# Patient Record
Sex: Female | Born: 1980 | Race: Black or African American | Hispanic: No | Marital: Single | Smoking: Former smoker
Health system: Southern US, Community
[De-identification: ages and names within clinical notes are randomized; demographics above are authoritative.]

## PROBLEM LIST (undated history)

## (undated) DIAGNOSIS — K219 Gastro-esophageal reflux disease without esophagitis: Secondary | ICD-10-CM

## (undated) DIAGNOSIS — M549 Dorsalgia, unspecified: Secondary | ICD-10-CM

---

## 2007-12-29 ENCOUNTER — Emergency Department (HOSPITAL_COMMUNITY): Admission: EM | Admit: 2007-12-29 | Discharge: 2007-12-29 | Payer: Self-pay | Admitting: Family Medicine

## 2008-01-02 ENCOUNTER — Emergency Department (HOSPITAL_COMMUNITY): Admission: EM | Admit: 2008-01-02 | Discharge: 2008-01-02 | Payer: Self-pay | Admitting: Family Medicine

## 2008-04-23 ENCOUNTER — Emergency Department (HOSPITAL_COMMUNITY): Admission: EM | Admit: 2008-04-23 | Discharge: 2008-04-24 | Payer: Self-pay | Admitting: Emergency Medicine

## 2008-04-29 ENCOUNTER — Encounter: Admission: RE | Admit: 2008-04-29 | Discharge: 2008-04-29 | Payer: Self-pay | Admitting: Emergency Medicine

## 2009-04-28 ENCOUNTER — Inpatient Hospital Stay (HOSPITAL_COMMUNITY): Admission: AD | Admit: 2009-04-28 | Discharge: 2009-04-28 | Payer: Self-pay | Admitting: Obstetrics and Gynecology

## 2009-06-09 ENCOUNTER — Emergency Department (HOSPITAL_COMMUNITY): Admission: EM | Admit: 2009-06-09 | Discharge: 2009-06-09 | Payer: Self-pay | Admitting: Emergency Medicine

## 2010-01-31 ENCOUNTER — Emergency Department (HOSPITAL_COMMUNITY)
Admission: EM | Admit: 2010-01-31 | Discharge: 2010-01-31 | Payer: Self-pay | Source: Home / Self Care | Admitting: Emergency Medicine

## 2010-02-03 ENCOUNTER — Emergency Department (HOSPITAL_COMMUNITY)
Admission: EM | Admit: 2010-02-03 | Discharge: 2010-02-03 | Payer: Self-pay | Source: Home / Self Care | Admitting: Emergency Medicine

## 2010-02-06 ENCOUNTER — Encounter: Payer: Self-pay | Admitting: Emergency Medicine

## 2010-02-07 LAB — URINALYSIS, ROUTINE W REFLEX MICROSCOPIC
Bilirubin Urine: NEGATIVE
Hgb urine dipstick: NEGATIVE
Ketones, ur: NEGATIVE mg/dL
Protein, ur: NEGATIVE mg/dL
Urine Glucose, Fasting: NEGATIVE mg/dL
pH: 5.5 (ref 5.0–8.0)

## 2010-02-07 LAB — BASIC METABOLIC PANEL
BUN: 6 mg/dL (ref 6–23)
CO2: 27 mEq/L (ref 19–32)
Calcium: 8.9 mg/dL (ref 8.4–10.5)
Chloride: 105 mEq/L (ref 96–112)
Creatinine, Ser: 0.79 mg/dL (ref 0.4–1.2)
Glucose, Bld: 106 mg/dL — ABNORMAL HIGH (ref 70–99)
Potassium: 3.5 mEq/L (ref 3.5–5.1)
Sodium: 141 mEq/L (ref 135–145)

## 2010-02-07 LAB — CBC
HCT: 37.6 % (ref 36.0–46.0)
Platelets: 291 10*3/uL (ref 150–400)
RDW: 13.5 % (ref 11.5–15.5)
WBC: 7.1 10*3/uL (ref 4.0–10.5)

## 2010-02-07 LAB — DIFFERENTIAL
Basophils Absolute: 0 10*3/uL (ref 0.0–0.1)
Eosinophils Absolute: 0.3 10*3/uL (ref 0.0–0.7)
Eosinophils Relative: 4 % (ref 0–5)
Lymphocytes Relative: 54 % — ABNORMAL HIGH (ref 12–46)
Neutrophils Relative %: 37 % — ABNORMAL LOW (ref 43–77)

## 2010-04-04 LAB — URINE CULTURE: Colony Count: 6000

## 2010-04-04 LAB — URINALYSIS, ROUTINE W REFLEX MICROSCOPIC
Bilirubin Urine: NEGATIVE
Glucose, UA: NEGATIVE mg/dL
Hgb urine dipstick: NEGATIVE
Specific Gravity, Urine: 1.021 (ref 1.005–1.030)
Urobilinogen, UA: 0.2 mg/dL (ref 0.0–1.0)
pH: 5.5 (ref 5.0–8.0)

## 2010-04-06 LAB — URINALYSIS, ROUTINE W REFLEX MICROSCOPIC
Bilirubin Urine: NEGATIVE
Glucose, UA: NEGATIVE mg/dL
Ketones, ur: NEGATIVE mg/dL
Nitrite: NEGATIVE
Protein, ur: NEGATIVE mg/dL
pH: 6 (ref 5.0–8.0)

## 2010-04-06 LAB — GC/CHLAMYDIA PROBE AMP, GENITAL
Chlamydia, DNA Probe: NEGATIVE
GC Probe Amp, Genital: NEGATIVE

## 2010-04-06 LAB — WET PREP, GENITAL
Clue Cells Wet Prep HPF POC: NONE SEEN
Trich, Wet Prep: NONE SEEN

## 2010-04-27 LAB — PREGNANCY, URINE: Preg Test, Ur: NEGATIVE

## 2010-04-27 LAB — WET PREP, GENITAL: Yeast Wet Prep HPF POC: NONE SEEN

## 2010-04-27 LAB — PROLACTIN: Prolactin: 6.9 ng/mL

## 2012-03-29 ENCOUNTER — Encounter (HOSPITAL_COMMUNITY): Payer: Self-pay | Admitting: Neurology

## 2012-03-29 ENCOUNTER — Emergency Department (HOSPITAL_COMMUNITY)
Admission: EM | Admit: 2012-03-29 | Discharge: 2012-03-29 | Disposition: A | Discharge status: Home / Self Care | Payer: Self-pay | Attending: Emergency Medicine | Admitting: Emergency Medicine

## 2012-03-29 DIAGNOSIS — R42 Dizziness and giddiness: Secondary | ICD-10-CM | POA: Insufficient documentation

## 2012-03-29 DIAGNOSIS — R209 Unspecified disturbances of skin sensation: Secondary | ICD-10-CM | POA: Insufficient documentation

## 2012-03-29 DIAGNOSIS — Z87891 Personal history of nicotine dependence: Secondary | ICD-10-CM | POA: Insufficient documentation

## 2012-03-29 DIAGNOSIS — R200 Anesthesia of skin: Secondary | ICD-10-CM

## 2012-03-29 DIAGNOSIS — M7989 Other specified soft tissue disorders: Secondary | ICD-10-CM | POA: Insufficient documentation

## 2012-03-29 DIAGNOSIS — Z79899 Other long term (current) drug therapy: Secondary | ICD-10-CM | POA: Insufficient documentation

## 2012-03-29 LAB — BASIC METABOLIC PANEL WITH GFR
Calcium: 9 mg/dL (ref 8.4–10.5)
Creatinine, Ser: 0.82 mg/dL (ref 0.50–1.10)
GFR calc non Af Amer: >90 mL/min (ref >90)
Sodium: 141 meq/L (ref 135–145)

## 2012-03-29 LAB — CBC WITH DIFFERENTIAL/PLATELET
Basophils Absolute: 0 K/uL (ref 0.0–0.1)
Basophils Relative: 0 % (ref 0–1)
Eosinophils Absolute: 0.3 K/uL (ref 0.0–0.7)
Eosinophils Relative: 4 % (ref 0–5)
HCT: 35.8 % — ABNORMAL LOW (ref 36.0–46.0)
Hemoglobin: 12.5 g/dL (ref 12.0–15.0)
Lymphocytes Relative: 50 % — ABNORMAL HIGH (ref 12–46)
Lymphs Abs: 3.4 10*3/uL (ref 0.7–4.0)
MCH: 29.2 pg (ref 26.0–34.0)
MCHC: 34.9 g/dL (ref 30.0–36.0)
MCV: 83.6 fL (ref 78.0–100.0)
Monocytes Absolute: 0.5 K/uL (ref 0.1–1.0)
Monocytes Relative: 7 % (ref 3–12)
Neutro Abs: 2.6 10*3/uL (ref 1.7–7.7)
Neutrophils Relative %: 38 % — ABNORMAL LOW (ref 43–77)
Platelets: 266 K/uL (ref 150–400)
RBC: 4.28 MIL/uL (ref 3.87–5.11)
RDW: 13.7 % (ref 11.5–15.5)
WBC: 6.7 K/uL (ref 4.0–10.5)

## 2012-03-29 LAB — BASIC METABOLIC PANEL
BUN: 9 mg/dL (ref 6–23)
CO2: 28 mEq/L (ref 19–32)
Chloride: 107 mEq/L (ref 96–112)
GFR calc Af Amer: >90 mL/min (ref >90)
Glucose, Bld: 96 mg/dL (ref 70–99)
Potassium: 4.1 mEq/L (ref 3.5–5.1)

## 2012-03-29 NOTE — ED Provider Notes (Signed)
History     CSN: [id number]  Arrival date & time 03/29/12  0904   First MD Initiated Contact with Patient 03/29/12 [phone]      Chief Complaint  Patient presents with  . Numbness    (Consider location/radiation/quality/duration/timing/severity/associated sxs/prior treatment) HPI Comments: 32 yo female with no pertinent past medical history presents to the ED today with numbness of the tongue beginning Wednesday of last week (03/20/12), progressing to include her face bilaterally on Wednesday (03/27/12) of this week.  Numbness began on the tip of the tongue and was intermittent.  Monday (03/25/12) the tongue numbness became constant.  She reports episodes of lightheadedness occurring about once daily with onset mostly with a change from sitting to standing position.  She has not taken any medication for it and nothing makes it worse.  She denies ever having anything like this before.  Denies recent illness, itching of the tongue/face, swelling of the tongue/face, dysphagia, diminished taste, diminished smell, hearing changes, visual changes, speech difficulties, chest pain, SOB, N/V.  The history is provided by the patient.    History reviewed. No pertinent past medical history.  Past Surgical History  Procedure Laterality Date  . Cesarean section      No family history on file.  History  Substance Use Topics  . Smoking status: Former Games developer  . Smokeless tobacco: Not on file  . Alcohol Use: No    OB History   Grav Para Term Preterm Abortions TAB SAB Ect Mult Living                  Review of Systems  Constitutional: Negative for fever, chills and unexpected weight change.  HENT: Negative for hearing loss, ear pain, facial swelling and tinnitus.   Eyes: Negative for visual disturbance.  Respiratory: Negative for shortness of breath.   Cardiovascular: Positive for leg swelling. Negative for chest pain.       Pt noted some leg swelling last week.  Unable to put shoes on.  No  edema on exam today.  Endocrine: Negative for cold intolerance, heat intolerance, polydipsia, polyphagia and polyuria.  Genitourinary: Negative for dysuria, frequency, difficulty urinating and menstrual problem.  Neurological: Positive for light-headedness and numbness. Negative for dizziness, seizures, syncope, facial asymmetry, speech difficulty, weakness and headaches.  All other systems reviewed and are negative.    Allergies  Latex  Home Medications   Current Outpatient Rx  Name  Route  Sig  Dispense  Refill  . ranitidine (ZANTAC) 150 MG tablet   Oral   Take 150 mg by mouth daily.         . vitamin C (ASCORBIC ACID) 500 MG tablet   Oral   Take 500 mg by mouth daily.           BP 114/79  Pulse 67  Temp(Src) 98.1 F (36.7 C) (Oral)  Resp 18  SpO2 97%  LMP 03/08/2012  Physical Exam  Constitutional: She is oriented to person, place, and time. She appears well-developed and well-nourished.  HENT:  Head: Normocephalic and atraumatic.  Right Ear: External ear normal.  Left Ear: External ear normal.  Nose: Nose normal.  Mouth/Throat: Oropharynx is clear and moist.  TM not visible bilaterally due to cerumen build up.  Eyes: Conjunctivae and EOM are normal. Pupils are equal, round, and reactive to light.  Neck: Normal range of motion. Neck supple. No tracheal deviation present. No thyromegaly present.  Cardiovascular: Normal rate, regular rhythm, normal heart sounds and intact distal  pulses.  Exam reveals no gallop and no friction rub.   No murmur heard. Pulmonary/Chest: Effort normal and breath sounds normal. No respiratory distress. She has no wheezes. She has no rales. She exhibits no tenderness.  Abdominal: Soft. Bowel sounds are normal. She exhibits no distension and no mass. There is no tenderness. There is no rebound and no guarding.  Musculoskeletal: Normal range of motion. She exhibits no edema and no tenderness.  Lymphadenopathy:    She has no cervical  adenopathy.  Neurological: She is alert and oriented to person, place, and time. She has normal strength and normal reflexes. No cranial nerve deficit or sensory deficit. She exhibits normal muscle tone. Coordination normal. GCS eye subscore is 4. GCS verbal subscore is 5. GCS motor subscore is 6.  Skin: Skin is warm and dry.    ED Course  Procedures (including critical care time)  Labs Reviewed  CBC WITH DIFFERENTIAL - Abnormal; Notable for the following:    HCT 35.8 (*)    Neutrophils Relative 38 (*)    Lymphocytes Relative 50 (*)    All other components within normal limits  BASIC METABOLIC PANEL   No results found.   1. Facial numbness     ECG at time 10:34 shows NSR at rate 66, normal axis, intervals, and no ST or T wave abn's. No prior ECG available. Poor R wave progression from leads V2-V3, possibly due to body habitus.  Interpretation is borderline ECG.    MDM  Pt with subjective sensory abn involving tongue and face.  No weakness, no speech difficulty, no stroke symptoms.  No fever, no objective slurred speech, facial droop, or weakness on exam, gait normal.  Needs outpt follow up.  Electrolytes normal.  Pt denies HA.  ECG shows no acute abn's.  Pt had no CP, SOB.  Will refer to Adult Care clinic at Urgent Care and to [facility].          Gavin Pound. Ghim, MD 03/29/12 1151

## 2012-03-29 NOTE — Discharge Instructions (Signed)
 Your electrolytes and EKG were fine.  Please call [phone] to follow up at [facility], or contact [facility] for follow up.   RESOURCE GUIDE  Chronic Pain Problems: Contact [facility]  [phone] Patients need to be referred by their primary care doctor.  Insufficient Money for Medicine: Contact United Way:  call 211.   No Primary Care Doctor: - Call [facility]  [phone] - can help you locate a primary care doctor that  accepts your insurance, provides certain services, etc. - Physician Referral Service- [phone]  Agencies that provide inexpensive medical care: - [facility]  [phone] - [facility]  [phone] - [facility]  [phone] - Women's Clinic  [phone] - Planned Parenthood  [phone] [facility]  [phone]  Medicaid-accepting [facility] Providers: - [facility]- [address], [address]  [phone], Mon-Fri 9am-7pm, Sat 9am-1pm - [facility]- [address] [place], [address] [place]  [phone] - [facility]- [address] [place]  [phone] [facility]- [address]  [phone] - Kennieth Leech- [address] [place], [address], [phone]  Only accepts [place] patients after they have their name  applied to their card  Self Pay (no insurance) in [place]: - Sickle Cell Patients: Dr Camellia Hutchinson, [facility] Internal Medicine  [address] [place], [phone] - [facility]gent Care- [address] [place]  [phone]       [facility] [place]- [address]       -     [facility]- see information above (Speak to [employer] if you do not have insurance)       -  [facility]- [address],  [phone]       -  [facility]- [address] [phone]       -  Dr Catalina-  [address], [place], [phone]       -  [facility] - [address] [phone]       -  [facility]- [address] [phone], also [address], [phone]       -    [facility]- [address] [place], [phone], 1st & 3rd Saturday        every month, 10am-1pm  [facility] [address] [place], [place] [address] [phone]  [facility] [address] [place], [place] [address] [phone]  1) Find a Doctor and Pay Out of Pocket Although you won't have to find out who is covered by your insurance plan, it is a good idea to ask around and get recommendations. You will then need to call the office and see if the doctor you have chosen will accept you as a new patient and what types of options they offer for patients who are self-pay. Some doctors offer discounts or will set up payment plans for their patients who do not have insurance, but you will need to ask so you aren't surprised when you get to your appointment.  2) Contact Your Local Health Department Not all health departments have doctors that can see patients for sick visits, but many do, so it is worth a call  to see if yours does. If you don't know where your local health department is, you can check in your phone book. The CDC also has a tool to help you locate your state's health department, and many state websites also have listings of all of their local health departments.  3) Find a Walk-in Clinic If your illness is not likely to be very severe or complicated, you may want to try a walk in clinic. These are popping up all over the country in pharmacies, drugstores, and shopping centers. They're usually staffed by nurse practitioners or physician assistants that have been trained to treat common illnesses and complaints. They're usually fairly quick and inexpensive. However, if you have serious medical issues or chronic medical problems, these  are probably not your best option  STD Testing - [facility] Department of [facility], STD Clinic, [address] [place], phone [phone] or [phone].  Monday - Friday, call for an appointment. [facility] Department of [employer], STD Clinic, [address], [place], phone [phone] or [phone].  Monday - Friday, call for an appointment.  Abuse/Neglect: [facility] Child Abuse Hotline 364-175-6465 [facility] Child Abuse Hotline 587-743-1272 (After Hours)  Emergency Shelter:  [facility] 971-180-9065  Maternity Homes: - Room at the [facility] [phone] - [facility] [phone]  MRSA Hotline #:   (463)712-7864  Dental Assistance If unable to pay or uninsured, contact:  [facility]. to become qualified for the adult dental clinic.  Patients with Medicaid: [facility] [phone] [address] [place], [phone] [address] [phone]  If unable to pay, or uninsured, contact [facility] [phone] in [place], [phone] in [facility]) to become qualified for the adult dental clinic  [facility] [address] [place], [place] [address] [phone] [email or web address]  Other Proofreader Services: - [facility]- [address] [place], [place], [place], [address], [phone], Ext. [phone], 2nd and 4th Thursday of the month at 6:30am.  10 clients each day by appointment, can sometimes see walk-in patients if someone does not show for an appointment. [facility]- [address] [place], [place], [address], [phone] - [facility] [address] [place], [place], [address], [phone] - [facility]- [phone] [facility] Health Department- [phone] [facility] Health [phone]       Behavioral Health Resources in the  [facility]  Intensive Outpatient Programs: [facility]      [address] [place], [place] [phone] Both a day and evening program       [facility] Outpatient     [address]        [place], [place] [address] [phone]         ADS: [facility] Drug S[facility]cs [address] [place] [phone]  [facility] Mental Health ACCESS LINE: [phone] or [phone] [address] [place], [place] [address] [email or web address]   Substance Abuse Resources: - [facility]  [phone] - [facility] [phone] - [facility] [phone] [facility] [place] [phone] - Residential & Outpatient Substance Abuse Program  [phone]  Psychological Services: [facility]  [phone] [facility] Services  [phone] - [facility], [phone] [place]. [address] [place], ACCESS LINE: 4308495726 or [phone], [email or web address]  Mobile Crisis Teams:  [facility]atives         Mobile Crisis Care Unit [phone]             [facility] [address] [place] [phone]                                         Interventionist [address] [place] [phone]  Self-Help/Support Groups: Mental Health Assoc. of [employer] of support groups [phone] (call for more info)   Narcotics Anonymous (NA) Caring Services [address] [place] - 2 meetings at this location  Residential Treatment Programs:  [facility]      [address]        [place]       [phone]         [facility] [address] [place] [address] [place], [place]  [address] [phone]  [facility]  [address] [place], [place]  [address] [phone] Admissions: 8am-3pm M-F  [facility]     [address]        [place], [place] [address]       [phone]         [facility] [address] [place], [place] [phone]  [facility] [address] [place], [place] [phone]  [facility] - Intensive Outpatient      [address]     [place], [place]       [phone]         [facility] (Addiction Recovery Care [facility]oc.)     [address] [place], [place] [phone] or [phone]  Residential Treatment Services (RTS), Medicaid [address] [place], [place] [phone]  Fellowship [address]                                               [address] [place] [phone]  [facility] Resources: [phone]               General Therapy                                                Mliss Rival, PhD        [address] [place], [place] [address]         [phone]   Insurance  [facility]   [address] [place], [place] [address] [phone]  [facility] [address] [place], [place] [address] [phone] Insurance/Medicaid/sponsorship through [facility]  Faith and Families                                              [address] Suite [phone]  [place], [place] [address]    Therapy/tele-psych/case         [phone]          [facility] [address], [place]  [address]  Adolescent/group home/case management [phone]                                           Recardo Rival PhD       General therapy       Insurance   [phone]         Dr. Curry, Cash, M-F [phone]  [facility] [place] [facility]  [employer] [facility] Dept. [address]                 [address]         [address] [place] [address]  Tinnie Keenan Keenan Phone:  [phone]                                  Phone:  [phone]                   Phone:  [phone]  [facility] Mental Health, [phone] - [facility] [place] - [facility]- [phone]       -     [facility] in [place], [address],             [phone], Insurance  [place] Child Abuse Hotline 8142883306 or [phone] (After Hours)

## 2012-03-29 NOTE — ED Notes (Signed)
Pt reporting tongue numbness x 1 week. States that since Wednesday her tongue would get numb first then spread to the rest of her head. Happens during the day, then subsides at night. Speech is clear, decreased sensation when touching face. Denies changes to taste. Pt is ambulatory. Alert and oriented.

## 2012-10-19 ENCOUNTER — Encounter (HOSPITAL_COMMUNITY): Payer: Self-pay | Admitting: Emergency Medicine

## 2012-10-19 ENCOUNTER — Emergency Department (HOSPITAL_COMMUNITY)
Admission: EM | Admit: 2012-10-19 | Discharge: 2012-10-19 | Disposition: A | Discharge status: Home / Self Care | Payer: Medicaid Other | Attending: Emergency Medicine | Admitting: Emergency Medicine

## 2012-10-19 DIAGNOSIS — K029 Dental caries, unspecified: Secondary | ICD-10-CM | POA: Insufficient documentation

## 2012-10-19 DIAGNOSIS — Z91040 Latex allergy status: Secondary | ICD-10-CM | POA: Insufficient documentation

## 2012-10-19 DIAGNOSIS — Z87891 Personal history of nicotine dependence: Secondary | ICD-10-CM | POA: Insufficient documentation

## 2012-10-19 DIAGNOSIS — K089 Disorder of teeth and supporting structures, unspecified: Secondary | ICD-10-CM | POA: Insufficient documentation

## 2012-10-19 DIAGNOSIS — K0889 Other specified disorders of teeth and supporting structures: Secondary | ICD-10-CM

## 2012-10-19 MED ORDER — PENICILLIN V POTASSIUM 250 MG PO TABS: 500.0000 mg | ORAL_TABLET | Freq: Four times a day (QID) | ORAL | Status: AC

## 2012-10-19 MED ORDER — IBUPROFEN 600 MG PO TABS: 600.0000 mg | ORAL_TABLET | Freq: Four times a day (QID) | ORAL | Status: DC | PRN

## 2012-10-19 NOTE — ED Notes (Signed)
Patient discharged to home with family. NAD.  

## 2012-10-19 NOTE — ED Notes (Signed)
Patient presents to ED with c/o dental pain on right side for 2 days.

## 2012-10-19 NOTE — ED Provider Notes (Signed)
CSN: [id number]     Arrival date & time 10/19/12  9562 History   First MD Initiated Contact with Patient 10/19/12 [phone]     Chief Complaint  Patient presents with  . Dental Pain   (Consider location/radiation/quality/duration/timing/severity/associated sxs/prior Treatment) Patient is a 32 y.o. female presenting with tooth pain. The history is provided by the patient. No language interpreter was used.  Dental Pain Location:  Lower Lower teeth location: multiple lower molars. Quality:  Shooting and sharp Severity:  Severe Onset quality:  Gradual Duration:  2 days Timing:  Constant Progression:  Waxing and waning Chronicity:  Recurrent Context: poor dentition   Previous work-up:  Dental exam (extractions) Relieved by:  Ice and NSAIDs Worsened by:  Cold food/drink and hot food/drink Associated symptoms: facial pain   Associated symptoms: no congestion, no difficulty swallowing, no drooling, no facial swelling, no fever, no gum swelling, no headaches, no neck pain, no neck swelling, no oral bleeding, no oral lesions and no trismus   Risk factors: periodontal disease     History reviewed. No pertinent past medical history. Past Surgical History  Procedure Laterality Date  . Cesarean section     History reviewed. No pertinent family history. History  Substance Use Topics  . Smoking status: Former Games developer  . Smokeless tobacco: Not on file  . Alcohol Use: No   OB History   Grav Para Term Preterm Abortions TAB SAB Ect Mult Living                 Review of Systems  Constitutional: Negative for fever, chills, diaphoresis, activity change, appetite change and fatigue.  HENT: Negative for congestion, sore throat, facial swelling, rhinorrhea, drooling, mouth sores, neck pain and neck stiffness.   Eyes: Negative for photophobia and discharge.  Respiratory: Negative for cough, chest tightness and shortness of breath.   Cardiovascular: Negative for chest pain, palpitations and leg  swelling.  Gastrointestinal: Negative for nausea, vomiting, abdominal pain and diarrhea.  Endocrine: Negative for polydipsia and polyuria.  Genitourinary: Negative for dysuria, frequency, difficulty urinating and pelvic pain.  Musculoskeletal: Negative for back pain and arthralgias.  Skin: Negative for color change and wound.  Allergic/Immunologic: Negative for immunocompromised state.  Neurological: Negative for facial asymmetry, weakness, numbness and headaches.  Hematological: Does not bruise/bleed easily.  Psychiatric/Behavioral: Negative for confusion and agitation.    Allergies  Latex  Home Medications   Current Outpatient Rx  Name  Route  Sig  Dispense  Refill  . ibuprofen (ADVIL,MOTRIN) 200 MG tablet   Oral   Take 800 mg by mouth every 6 (six) hours as needed for pain.         . ranitidine (ZANTAC) 150 MG tablet   Oral   Take 150 mg by mouth daily as needed for heartburn.          Marland Kitchen ibuprofen (ADVIL,MOTRIN) 600 MG tablet   Oral   Take 1 tablet (600 mg total) by mouth every 6 (six) hours as needed for pain.   30 tablet   0   . penicillin v potassium (VEETID) 250 MG tablet   Oral   Take 2 tablets (500 mg total) by mouth 4 (four) times daily.   80 tablet   0    BP 134/78  Pulse 77  Temp(Src) 99 F (37.2 C) (Oral)  Resp 14  SpO2 99% Physical Exam  Constitutional: She is oriented to person, place, and time. She appears well-developed and well-nourished. No distress.  HENT:  Head:  Normocephalic and atraumatic.  Mouth/Throat: Uvula is midline. No oral lesions. No trismus in the jaw. Dental caries present. No lacerations. No oropharyngeal exudate.    Pain w/ palpation of multiple molars, but no gum swelling, purulence  Eyes: Pupils are equal, round, and reactive to light.  Neck: Normal range of motion. Neck supple.  Cardiovascular: Normal rate, regular rhythm and normal heart sounds.  Exam reveals no gallop and no friction rub.   No murmur  heard. Pulmonary/Chest: Effort normal and breath sounds normal. No respiratory distress. She has no wheezes. She has no rales.  Abdominal: Soft. Bowel sounds are normal. She exhibits no distension and no mass. There is no tenderness. There is no rebound and no guarding.  Musculoskeletal: Normal range of motion. She exhibits no edema and no tenderness.  Neurological: She is alert and oriented to person, place, and time.  Skin: Skin is warm and dry.  Psychiatric: She has a normal mood and affect.    ED Course  Procedures (including critical care time) Labs Review Labs Reviewed - No data to display Imaging Review No results found.  MDM   1. Pain, dental    Pt is a 32 y.o. female with Pmhx as above who presents with several months of intermittent lower right dental pain, worse for 2 days, with intermittent paresthesias eminating from R face at site of pain.  No paresthesias this morning, denies fever, facial swelling.  Has had pain w/ eating, but no trismus, no difficulty swallowing.  On PE, VSS, pt in NAD.  She has pain w/ palpation of multiple lower molars, poor dentition, but no obvious external infection.  Neuro exam unremarkable.  Will place on pen VK for likely periapical abscess, have her f/u with dentistry as outpt.  Return precautions given for new or worsening symptoms including fever, facial swelling, trouble tolerating abx.        Shanna Cisco, MD 10/19/12 [phone]

## 2013-03-03 ENCOUNTER — Emergency Department (HOSPITAL_BASED_OUTPATIENT_CLINIC_OR_DEPARTMENT_OTHER)
Admission: EM | Admit: 2013-03-03 | Discharge: 2013-03-03 | Disposition: A | Discharge status: Home / Self Care | Payer: Medicaid Other | Attending: Emergency Medicine | Admitting: Emergency Medicine

## 2013-03-03 ENCOUNTER — Encounter (HOSPITAL_BASED_OUTPATIENT_CLINIC_OR_DEPARTMENT_OTHER): Payer: Self-pay | Admitting: Emergency Medicine

## 2013-03-03 DIAGNOSIS — Y929 Unspecified place or not applicable: Secondary | ICD-10-CM | POA: Insufficient documentation

## 2013-03-03 DIAGNOSIS — S39012A Strain of muscle, fascia and tendon of lower back, initial encounter: Secondary | ICD-10-CM

## 2013-03-03 DIAGNOSIS — Z91040 Latex allergy status: Secondary | ICD-10-CM | POA: Insufficient documentation

## 2013-03-03 DIAGNOSIS — S239XXA Sprain of unspecified parts of thorax, initial encounter: Secondary | ICD-10-CM | POA: Insufficient documentation

## 2013-03-03 DIAGNOSIS — X500XXA Overexertion from strenuous movement or load, initial encounter: Secondary | ICD-10-CM | POA: Insufficient documentation

## 2013-03-03 DIAGNOSIS — Y990 Civilian activity done for income or pay: Secondary | ICD-10-CM | POA: Insufficient documentation

## 2013-03-03 DIAGNOSIS — Y9389 Activity, other specified: Secondary | ICD-10-CM | POA: Insufficient documentation

## 2013-03-03 DIAGNOSIS — Z87891 Personal history of nicotine dependence: Secondary | ICD-10-CM | POA: Insufficient documentation

## 2013-03-03 LAB — URINALYSIS, ROUTINE W REFLEX MICROSCOPIC
Bilirubin Urine: NEGATIVE
GLUCOSE, UA: NEGATIVE mg/dL
HGB URINE DIPSTICK: NEGATIVE
KETONES UR: NEGATIVE mg/dL
LEUKOCYTES UA: NEGATIVE
Nitrite: NEGATIVE
PH: 5.5 (ref 5.0–8.0)
Protein, ur: NEGATIVE mg/dL
Specific Gravity, Urine: 1.017 (ref 1.005–1.030)
Urobilinogen, UA: 0.2 mg/dL (ref 0.0–1.0)

## 2013-03-03 MED ORDER — DIAZEPAM 5 MG PO TABS: ORAL_TABLET | ORAL | Status: DC

## 2013-03-03 MED ORDER — HYDROCODONE-ACETAMINOPHEN 5-325 MG PO TABS
2.0000 | ORAL_TABLET | ORAL | Status: DC | PRN
Start: 2013-03-03 — End: 2015-07-08

## 2013-03-03 MED ORDER — KETOROLAC TROMETHAMINE 60 MG/2ML IM SOLN
60.0000 mg | Freq: Once | INTRAMUSCULAR | Status: AC
  Administered 2013-03-03: 60 mg via INTRAMUSCULAR
  Filled 2013-03-03: qty 2

## 2013-03-03 NOTE — ED Notes (Signed)
NP at bedside.

## 2013-03-03 NOTE — Discharge Instructions (Signed)
Back Pain, Adult Low back pain is very common. About 1 in 5 people have back pain.The cause of low back pain is rarely dangerous. The pain often gets better over time.About half of people with a sudden onset of back pain feel better in just 2 weeks. About 8 in 10 people feel better by 6 weeks.  CAUSES Some common causes of back pain include:  Strain of the muscles or ligaments supporting the spine.  Wear and tear (degeneration) of the spinal discs.  Arthritis.  Direct injury to the back. DIAGNOSIS Most of the time, the direct cause of low back pain is not known.However, back pain can be treated effectively even when the exact cause of the pain is unknown.Answering your caregiver's questions about your overall health and symptoms is one of the most accurate ways to make sure the cause of your pain is not dangerous. If your caregiver needs more information, he or she may order lab work or imaging tests (X-rays or MRIs).However, even if imaging tests show changes in your back, this usually does not require surgery. HOME CARE INSTRUCTIONS For many people, back pain returns.Since low back pain is rarely dangerous, it is often a condition that people can learn to [facility] their own.   Remain active. It is stressful on the back to sit or stand in one place. Do not sit, drive, or stand in one place for more than 30 minutes at a time. Take short walks on level surfaces as soon as pain allows.Try to increase the length of time you walk each day.  Do not stay in bed.Resting more than 1 or 2 days can delay your recovery.  Do not avoid exercise or work.Your body is made to move.It is not dangerous to be active, even though your back may hurt.Your back will likely heal faster if you return to being active before your pain is gone.  Pay attention to your body when you bend and lift. Many people have less discomfortwhen lifting if they bend their knees, keep the load close to their bodies,and  avoid twisting. Often, the most comfortable positions are those that put less stress on your recovering back.  Find a comfortable position to sleep. Use a firm mattress and lie on your side with your knees slightly bent. If you lie on your back, put a pillow under your knees.  Only take over-the-counter or prescription medicines as directed by your caregiver. Over-the-counter medicines to reduce pain and inflammation are often the most helpful.Your caregiver may prescribe muscle relaxant drugs.These medicines help dull your pain so you can more quickly return to your normal activities and healthy exercise.  Put ice on the injured area.  Put ice in a plastic bag.  Place a towel between your skin and the bag.  Leave the ice on for 15-20 minutes, 03-04 times a day for the first 2 to 3 days. After that, ice and heat may be alternated to reduce pain and spasms.  Ask your caregiver about trying back exercises and gentle massage. This may be of some benefit.  Avoid feeling anxious or stressed.Stress increases muscle tension and can worsen back pain.It is important to recognize when you are anxious or stressed and learn ways to manage it.Exercise is a great option. SEEK MEDICAL CARE IF:  You have pain that is not relieved with rest or medicine.  You have pain that does not improve in 1 week.  You have new symptoms.  You are generally not feeling well. SEEK  IMMEDIATE MEDICAL CARE IF:   You have pain that radiates from your back into your legs.  You develop new bowel or bladder control problems.  You have unusual weakness or numbness in your arms or legs.  You develop nausea or vomiting.  You develop abdominal pain.  You feel faint. Document Released: 01/02/2005 Document Revised: 07/04/2011 Document Reviewed: 05/23/2010 [facility] Patient Information 2014 [place]Exit[place], [place].  Back Exercises Back exercises help treat and prevent back injuries. The goal of back exercises is to increase  the strength of your abdominal and back muscles and the flexibility of your back. These exercises should be started when you no longer have back pain. Back exercises include:  Pelvic Tilt. Lie on your back with your knees bent. Tilt your pelvis until the lower part of your back is against the floor. Hold this position 5 to 10 sec and repeat 5 to 10 times.  Knee to Chest. Pull first 1 knee up against your chest and hold for 20 to 30 seconds, repeat this with the other knee, and then both knees. This may be done with the other leg straight or bent, whichever feels better.  Sit-Ups or Curl-Ups. Bend your knees 90 degrees. Start with tilting your pelvis, and do a partial, slow sit-up, lifting your trunk only 30 to 45 degrees off the floor. Take at least 2 to 3 seconds for each sit-up. Do not do sit-ups with your knees out straight. If partial sit-ups are difficult, simply do the above but with only tightening your abdominal muscles and holding it as directed.  Hip-Lift. Lie on your back with your knees flexed 90 degrees. Push down with your feet and shoulders as you raise your hips a couple inches off the floor; hold for 10 seconds, repeat 5 to 10 times.  Back arches. Lie on your stomach, propping yourself up on bent elbows. Slowly press on your hands, causing an arch in your low back. Repeat 3 to 5 times. Any initial stiffness and discomfort should lessen with repetition over time.  Shoulder-Lifts. Lie face down with arms beside your body. Keep hips and torso pressed to floor as you slowly lift your head and shoulders off the floor. Do not overdo your exercises, especially in the beginning. Exercises may cause you some mild back discomfort which lasts for a few minutes; however, if the pain is more severe, or lasts for more than 15 minutes, do not continue exercises until you see your caregiver. Improvement with exercise therapy for back problems is slow.  See your caregivers for assistance with  developing a proper back exercise program. Document Released: 02/10/2004 Document Revised: 03/27/2011 Document Reviewed: 11/03/2010 [facility] BeachExitCare Patient Information 2014 [place]ExitCare, [place]LLC.  Ibuprofen or alleve for discomfort Valium for muscle spasm Use heat/ice packs as needed for discomfort

## 2013-03-03 NOTE — ED Provider Notes (Signed)
CSN: [id number]     Arrival date & time 03/03/13  1020 History   First MD Initiated Contact with Patient 03/03/13 1032     Chief Complaint  Patient presents with  . back pain      (Consider location/radiation/quality/duration/timing/severity/associated sxs/prior Treatment) Patient is a 33 y.o. female presenting with back pain. The history is provided by the patient. No language interpreter was used.  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 She reports that she has been having back pain for the past 24 hours and has tried alleve with no relief. She reports that she was at work today trying to lift a patient and her back pain became worse. She feels like she may have strained her back. She reports that she is having pain in the thoracic region of her back. She denies dysuria, hematuria or other urinary symptoms. No fever or recent illness.  History reviewed. No pertinent past medical history. Past Surgical History  Procedure Laterality Date  . Cesarean section     History reviewed. No pertinent family history. History  Substance Use Topics  . Smoking status: Former Games developermoker  . Smokeless tobacco: Not on file  . Alcohol Use: No   OB History   Grav Para Term Preterm Abortions TAB SAB Ect Mult Living                 Review of Systems  Constitutional: Negative for fever.  Cardiovascular: Negative for chest pain.  Gastrointestinal: Negative for abdominal pain and bowel incontinence.  Genitourinary: Negative for bladder incontinence, dysuria and pelvic pain.  Musculoskeletal: Positive for back  pain.  Neurological: Negative for tingling and weakness.      Allergies  Latex  Home Medications   Current Outpatient Rx  Name  Route  Sig  Dispense  Refill  . ibuprofen (ADVIL,MOTRIN) 200 MG tablet   Oral   Take 800 mg by mouth every 6 (six) hours as needed for pain.         Marland Kitchen. ibuprofen (ADVIL,MOTRIN) 600 MG tablet   Oral   Take 1 tablet (600 mg total) by mouth every 6 (six) hours as needed for pain.   30 tablet   0   . ranitidine (ZANTAC) 150 MG tablet   Oral   Take 150 mg by mouth daily as needed for heartburn.           BP 151/85  Pulse 78  Temp(Src) 98.7 F (37.1 C) (Oral)  Resp 18  Ht 5\' 8"  (1.727 m)  Wt 330 lb (149.687 kg)  BMI 50.19 kg/m2  SpO2 100%  LMP 02/16/2013 Physical Exam  Nursing note and vitals reviewed. Constitutional: She is oriented to person, place, and time. She appears well-developed and well-nourished.  Eyes: Conjunctivae and EOM are normal.  Neck: Normal range of motion. Neck supple. No tracheal deviation present. No thyromegaly present.  Cardiovascular: Normal rate, regular rhythm and normal heart sounds.   Pulmonary/Chest: Effort normal and breath sounds normal.  Abdominal: Soft. Bowel sounds are normal.  Musculoskeletal: Normal range of motion.  Paravertebral tenderness in the thoracic region. No midline spinal tenderness, stepoff's or deformities. No numbness or tingling. Good ROM and strength. Steady gait and good coordination. No focal weakness or deficits.    Neurological: She is alert and oriented to person, place, and time. No cranial nerve deficit. Coordination abnormal.  Skin: Skin is warm and dry.  Psychiatric: She has a normal mood and affect. Her behavior is normal. Judgment and thought content normal.    ED Course  Procedures (including critical care time) Labs Review Labs Reviewed - No data to display Imaging Review No results found.  EKG Interpretation   None       MDM   Final diagnoses:  Back strain     Pt given Toradol injection here in ER with some relief. Negative urinalysis. Probable back strain. Good strength, coordination and ROM. No gait deficits. Prescriptions for valium and hydrocodone given. Discussed plan with pt and she agrees. Will take pain meds for the first few days with rest and then start stretching exercises. Follow-up with PCP or return here if symptoms worsen.     Irish Elders, NP 03/09/13 1310

## 2013-03-03 NOTE — ED Notes (Signed)
Pt states yesterday she started having pain top of right thigh lasted for 2-3 hours states pain then moved to back and "moves around"

## 2013-03-12 NOTE — ED Provider Notes (Signed)
Medical screening examination/treatment/procedure(s) were performed by non-physician practitioner and as supervising physician I was immediately available for consultation/collaboration.     Geoffery Lyonsouglas Rodriques Badie, MD 03/12/13 [phone]

## 2014-06-04 ENCOUNTER — Emergency Department (HOSPITAL_BASED_OUTPATIENT_CLINIC_OR_DEPARTMENT_OTHER)
Admission: EM | Admit: 2014-06-04 | Discharge: 2014-06-04 | Disposition: A | Discharge status: Home / Self Care | Payer: Medicaid Other | Attending: Emergency Medicine | Admitting: Emergency Medicine

## 2014-06-04 ENCOUNTER — Emergency Department (HOSPITAL_BASED_OUTPATIENT_CLINIC_OR_DEPARTMENT_OTHER): Payer: Medicaid Other

## 2014-06-04 ENCOUNTER — Encounter (HOSPITAL_BASED_OUTPATIENT_CLINIC_OR_DEPARTMENT_OTHER): Payer: Self-pay | Admitting: *Deleted

## 2014-06-04 DIAGNOSIS — R079 Chest pain, unspecified: Secondary | ICD-10-CM

## 2014-06-04 DIAGNOSIS — Z87891 Personal history of nicotine dependence: Secondary | ICD-10-CM | POA: Insufficient documentation

## 2014-06-04 DIAGNOSIS — K219 Gastro-esophageal reflux disease without esophagitis: Secondary | ICD-10-CM | POA: Insufficient documentation

## 2014-06-04 DIAGNOSIS — Z91040 Latex allergy status: Secondary | ICD-10-CM | POA: Insufficient documentation

## 2014-06-04 HISTORY — DX: Gastro-esophageal reflux disease without esophagitis: K21.9

## 2014-06-04 LAB — CBC WITH DIFFERENTIAL/PLATELET
BASOS ABS: 0 10*3/uL (ref 0.0–0.1)
Basophils Relative: 0 % (ref 0–1)
EOS ABS: 0.3 10*3/uL (ref 0.0–0.7)
EOS PCT: 6 % — AB (ref 0–5)
HEMATOCRIT: 34.2 % — AB (ref 36.0–46.0)
HEMOGLOBIN: 11.6 g/dL — AB (ref 12.0–15.0)
LYMPHS ABS: 2.7 10*3/uL (ref 0.7–4.0)
Lymphocytes Relative: 45 % (ref 12–46)
MCH: 27.6 pg (ref 26.0–34.0)
MCHC: 33.9 g/dL (ref 30.0–36.0)
MCV: 81.4 fL (ref 78.0–100.0)
Monocytes Absolute: 0.5 10*3/uL (ref 0.1–1.0)
Monocytes Relative: 8 % (ref 3–12)
NEUTROS PCT: 41 % — AB (ref 43–77)
Neutro Abs: 2.5 10*3/uL (ref 1.7–7.7)
Platelets: 320 10*3/uL (ref 150–400)
RBC: 4.2 MIL/uL (ref 3.87–5.11)
RDW: 14.1 % (ref 11.5–15.5)
WBC: 6 10*3/uL (ref 4.0–10.5)

## 2014-06-04 LAB — BASIC METABOLIC PANEL
ANION GAP: 8 (ref 5–15)
BUN: 8 mg/dL (ref 6–20)
CO2: 25 mmol/L (ref 22–32)
CREATININE: 0.77 mg/dL (ref 0.44–1.00)
Calcium: 8.9 mg/dL (ref 8.9–10.3)
Chloride: 106 mmol/L (ref 101–111)
GFR calc non Af Amer: >60 mL/min (ref >60)
Glucose, Bld: 122 mg/dL — ABNORMAL HIGH (ref 65–99)
POTASSIUM: 3.7 mmol/L (ref 3.5–5.1)
SODIUM: 139 mmol/L (ref 135–145)

## 2014-06-04 LAB — TROPONIN I

## 2014-06-04 NOTE — ED Provider Notes (Signed)
CSN: [id number]     Arrival date & time 06/04/14  1233 History   First MD Initiated Contact with Patient 06/04/14 1241     Chief Complaint  Patient presents with  . Chest Pain     (Consider location/radiation/quality/duration/timing/severity/associated sxs/prior Treatment) HPI Comments: Pt comes in with c/o worsening cp for the last 2 days. Denies n/v, fever, cough. Intermittent sob. 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 She states that she is having pain in her upper back but it doesn't go thru from front to back. Pain in over the left breast. No jaw or neck pain, no diaphoresis. Nothing makes the pain better or worse. Pt is refusing pain medication at this time. Pt states that she stopped taken her zantac about 1 week ago because it was making her belch.  The history is provided by the patient. No language interpreter was used.    Past Medical History  Diagnosis Date  . GERD (gastroesophageal reflux disease)    Past Surgical History  Procedure Laterality Date  . Cesarean section     No family history on file. History  Substance Use Topics  . Smoking status: Former Games developermoker  . Smokeless tobacco: Not on file  . Alcohol Use: No   OB History    No data available     Review of Systems  All other systems reviewed and are negative.     Allergies  Latex  Home Medications   Prior to Admission medications   Medication Sig Start Date End Date Taking? Authorizing Provider  diazepam (VALIUM) 5 MG tablet Take 1 tablet by mouth every 6 hours as needed for muscle spasm. 03/03/13   Irish EldersKelly Walker, NP  HYDROcodone-acetaminophen (NORCO/VICODIN) 5-325 MG per tablet Take 2 tablets by mouth every 4 (four) hours as needed. 03/03/13   Irish EldersKelly Walker, NP  ibuprofen (ADVIL,MOTRIN) 200 MG tablet Take 800 mg by mouth every 6 (six) hours as needed for pain.    Historical Provider, MD   ibuprofen (ADVIL,MOTRIN) 600 MG tablet Take 1 tablet (600 mg total) by mouth every 6 (six) hours as needed for pain. 10/19/12   Toy CookeyMegan Docherty, MD  ranitidine (ZANTAC) 150 MG tablet Take 150 mg by mouth daily as needed for heartburn.     Historical Provider, MD   BP 145/117 mmHg  Pulse 87  Temp(Src) 98.6 F (37 C) (Oral)  Resp 20  Ht 5\' 10"  (1.778 m)  Wt 330 lb (149.687 kg)  BMI 47.35 kg/m2  SpO2 100%  LMP 05/31/2014 Physical Exam  Constitutional: She is oriented to person, place, and time. She appears well-developed and well-nourished.  HENT:  Right Ear: External ear normal.  Left Ear: External ear normal.  Mouth/Throat: Oropharynx is clear and moist.  Eyes: Conjunctivae and EOM are normal.  Cardiovascular: Normal rate, regular rhythm and normal heart sounds.   Pulmonary/Chest: Effort normal and breath sounds normal.  Abdominal: Soft. Bowel sounds are normal. There is no tenderness.  Musculoskeletal: Normal range of motion.  Neurological: She is alert and oriented to person, place, and time. Coordination normal.  Skin: Skin is warm and dry.  Psychiatric: She has a normal mood and affect.  Nursing note and vitals reviewed.   ED Course  Procedures (including critical care time) Labs Review Labs Reviewed  BASIC METABOLIC PANEL - Abnormal; Notable for the following:    Glucose,  Bld 122 (*)    All other components within normal limits  CBC WITH DIFFERENTIAL/PLATELET - Abnormal; Notable for the following:    Hemoglobin 11.6 (*)    HCT 34.2 (*)    Neutrophils Relative % 41 (*)    Eosinophils Relative 6 (*)    All other components within normal limits  TROPONIN I  TROPONIN I    Imaging Review Dg Chest 2 View  06/04/2014   CLINICAL DATA:  Chest pain for 2 days  EXAM: CHEST  2 VIEW  COMPARISON:  03/30/2014  FINDINGS: The heart size and mediastinal contours are within normal limits. Both lungs are clear. The visualized skeletal structures are unremarkable.  IMPRESSION: No  active cardiopulmonary disease.   Electronically Signed   By: Natasha MeadLiviu  Pop M.D.   On: 06/04/2014 13:34     EKG Interpretation   Date/Time:  Thursday Jun 04 2014 12:41:30 EDT Ventricular Rate:  86 PR Interval:  156 QRS Duration: 76 QT Interval:  376 QTC Calculation: 449 R Axis:   55 Text Interpretation:  Normal sinus rhythm Low voltage QRS Nonspecific T  wave abnormality Abnormal ECG No significant change since last tracing  Confirmed by Gwendolyn GrantWALDEN  MD, BLAIR ([id number]) on 06/04/2014 12:45:00 PM      MDM   Final diagnoses:  Chest pain    Obtained records for [facility]. Pt had negative cta as well. Discussed follow up with cardiology and gi with pt and that she needs to start taking medication for her gerd again    Teressa LowerVrinda Ladislaus Repsher, NP 06/04/14 1651  Elwin MochaBlair Walden, MD 06/05/14 [phone]

## 2014-06-04 NOTE — Discharge Instructions (Signed)
Chest Pain (Nonspecific) °It is often hard to give a diagnosis for the cause of chest pain. There is always a chance that your pain could be related to something serious, such as a heart attack or a blood clot in the lungs. You need to follow up with your doctor. °HOME CARE °· If antibiotic medicine was given, take it as directed by your doctor. Finish the medicine even if you start to feel better. °· For the next few days, avoid activities that bring on chest pain. Continue physical activities as told by your doctor. °· Do not use any tobacco products. This includes cigarettes, chewing tobacco, and e-cigarettes. °· Avoid drinking alcohol. °· Only take medicine as told by your doctor. °· Follow your doctor's suggestions for more testing if your chest pain does not go away. °· Keep all doctor visits you made. °GET HELP IF: °· Your chest pain does not go away, even after treatment. °· You have a rash with blisters on your chest. °· You have a fever. °GET HELP RIGHT AWAY IF:  °· You have more pain or pain that spreads to your arm, neck, jaw, back, or belly (abdomen). °· You have shortness of breath. °· You cough more than usual or cough up blood. °· You have very bad back or belly pain. °· You feel sick to your stomach (nauseous) or throw up (vomit). °· You have very bad weakness. °· You pass out (faint). °· You have chills. °This is an emergency. Do not wait to see if the problems will go away. Call your local emergency services (911 in [place] Do not drive yourself to the hospital. °MAKE SURE YOU:  °· Understand these instructions. °· Will watch your condition. °· Will get help right away if you are not doing well or get worse. °Document Released: 06/21/2007 Document Revised: 01/07/2013 Document Reviewed: 06/21/2007 °ExitCare® Patient Information ©2015 [employer], LLC. This information is not intended to replace advice given to you by your health care provider. Make sure you discuss any questions you have with your  health care provider. ° °Gastroesophageal Reflux Disease, Adult °Gastroesophageal reflux disease (GERD) happens when acid from your stomach goes into your food pipe (esophagus). The acid can cause a burning feeling in your chest. Over time, the acid can make small holes (ulcers) in your food pipe.  °HOME CARE °· Ask your doctor for advice about: °¨ Losing weight. °¨ Quitting smoking. °¨ Alcohol use. °· Avoid foods and drinks that make your problems worse. You may want to avoid: °¨ Caffeine and alcohol. °¨ Chocolate. °¨ Mints. °¨ Garlic and onions. °¨ Spicy foods. °¨ Citrus fruits, such as oranges, lemons, or limes. °¨ Foods that contain tomato, such as sauce, chili, salsa, and pizza. °¨ Fried and fatty foods. °· Avoid lying down for 3 hours before you go to bed or before you take a nap. °· Eat small meals often, instead of large meals. °· Wear loose-fitting clothing. Do not wear anything tight around your waist. °· Raise (elevate) the head of your bed 6 to 8 inches with wood blocks. Using extra pillows does not help. °· Only take medicines as told by your doctor. °· Do not take aspirin or ibuprofen. °GET HELP RIGHT AWAY IF:  °· You have pain in your arms, neck, jaw, teeth, or back. °· Your pain gets worse or changes. °· You feel sick to your stomach (nauseous), throw up (vomit), or sweat (diaphoresis). °· You feel short of breath, or you pass out (faint). °·   Your throw up is green, yellow, black, or looks like coffee grounds or blood. °· Your poop (stool) is red, bloody, or black. °MAKE SURE YOU:  °· Understand these instructions. °· Will watch your condition. °· Will get help right away if you are not doing well or get worse. °Document Released: 06/21/2007 Document Revised: 03/27/2011 Document Reviewed: 07/22/2010 °ExitCare® Patient Information ©2015 [employer], [employer]. This information is not intended to replace advice given to you by your health care provider. Make sure you discuss any questions you have with your  health care provider. ° °

## 2014-06-04 NOTE — ED Notes (Signed)
Squeezing in her left chest and pain in her back. Hx of GERD so she is not sure when the pain started but it got worse 2 days ago.

## 2015-07-08 ENCOUNTER — Emergency Department (HOSPITAL_BASED_OUTPATIENT_CLINIC_OR_DEPARTMENT_OTHER)
Admission: EM | Admit: 2015-07-08 | Discharge: 2015-07-08 | Disposition: A | Discharge status: Home / Self Care | Payer: Medicaid Other | Attending: Emergency Medicine | Admitting: Emergency Medicine

## 2015-07-08 ENCOUNTER — Encounter (HOSPITAL_BASED_OUTPATIENT_CLINIC_OR_DEPARTMENT_OTHER): Payer: Self-pay | Admitting: *Deleted

## 2015-07-08 DIAGNOSIS — M545 Low back pain, unspecified: Secondary | ICD-10-CM

## 2015-07-08 DIAGNOSIS — Z87891 Personal history of nicotine dependence: Secondary | ICD-10-CM | POA: Insufficient documentation

## 2015-07-08 MED ORDER — CYCLOBENZAPRINE HCL 10 MG PO TABS: 10.0000 mg | ORAL_TABLET | Freq: Two times a day (BID) | ORAL | Status: DC | PRN

## 2015-07-08 MED FILL — CYCLOBENZAPRINE 10 MG TAB: 10 | 10 days supply | Qty: 20 | Fill #0
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
## 2015-07-08 NOTE — ED Provider Notes (Signed)
CSN: [id number]     Arrival date & time 07/08/15  0848 History   First MD Initiated Contact with Patient 07/08/15 0913     Chief Complaint  Patient presents with  . Back Pain    HPI   35 year old female presents today with complaints of chronic back pain. Patient reports in 2004 [facility], has had chronic back pain ever since. She reports that the episodes have become more frequent, reports that she "throws her back out" with even small movements. The last few weeks have shown increased pain in her left lower lumbar region. 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 She denies any red flags for back pain. No abdominal pain.  Patient reports she has not seen an orthopedist for back specialist for this. She notes she has been taking ibuprofen with minimal relief of symptoms. Past Medical History  Diagnosis Date  . GERD (gastroesophageal reflux disease)    Past Surgical History  Procedure Laterality Date  . Cesarean section     History reviewed. No pertinent family history. Social History  Substance Use Topics  . Smoking status: Former Games developermoker  . Smokeless tobacco: None  . Alcohol Use: No   OB History    No data available     Review of Systems  All other systems reviewed and are negative.    Allergies  Latex  Home Medications   Prior to Admission medications   Medication Sig Start Date End Date Taking? Authorizing Provider  cyclobenzaprine (FLEXERIL) 10 MG tablet Take 1 tablet (10 mg total) by mouth 2 (two) times daily as needed for muscle spasms. 07/08/15   Benzion Mesta, PA-C   BP 152/99 mmHg  Pulse 73  Temp(Src) 99.4 F (37.4 C) (Oral)  Resp 18  Ht 5\' 8"  (1.727 m)  Wt 145.151 kg  BMI 48.67 kg/m2  SpO2 98%  LMP 06/28/2015 Physical Exam  Constitutional: She is oriented to person, place, and time. She appears well-developed and well-nourished. No distress.  HENT:  Head: Normocephalic and  atraumatic.  Eyes: Conjunctivae are normal. Pupils are equal, round, and reactive to light. Right eye exhibits no discharge. Left eye exhibits no discharge. No scleral icterus.  Neck: Normal range of motion. Neck supple. No JVD present. No tracheal deviation present.  Pulmonary/Chest: Effort normal. No stridor.  Musculoskeletal: Normal range of motion. She exhibits tenderness. She exhibits no edema.  No C, T, or L spine tenderness to palpation. No obvious signs of trauma, deformity, infection, step-offs. Lung expansion normal. No scoliosis or kyphosis. Bilateral lower extremity strength 5 out of 5, sensation grossly intact  Tenderness to palpation of the lumbar soft tissue  Straight leg negative Ambulates with no difficulty  Neurological: She is alert and oriented to person, place, and time. Coordination normal.  Skin: Skin is warm and dry. She is not diaphoretic.  Psychiatric: She has a normal mood and affect. Her behavior is normal. Judgment and thought content normal.  Nursing note and vitals reviewed.   ED Course  Procedures (including critical care time) Labs Review Labs Reviewed - No data to display  Imaging Review No results found. I have personally reviewed and evaluated these images and lab results as part of my medical decision-making.   EKG Interpretation None      MDM   Final diagnoses:  Bilateral low back pain without sciatica    Labs:  Imaging:  Consults:  Therapeutics:  Discharge Meds:   Assessment/Plan: 35 year old female presents today with chronic uncomplicated back pain. Patient has no red flags, no abdominal pain, no fever. This is chronic in nature with acute worsening of symptoms. Patient ambulatory without difficulty, she will be given a work note, Flexeril, referred to orthopedist for further evaluation and management. Strict return precautions given, patient verbalized understanding and agreement to today's plan had no further questions or  concerns at time of discharge         Eyvonne MechanicJeffrey Preethi Scantlebury, PA-C 07/08/15 0941  Gwyneth SproutWhitney Plunkett, MD 07/08/15 1045

## 2015-07-08 NOTE — Discharge Instructions (Signed)

## 2016-02-16 ENCOUNTER — Emergency Department (HOSPITAL_BASED_OUTPATIENT_CLINIC_OR_DEPARTMENT_OTHER)
Admission: EM | Admit: 2016-02-16 | Discharge: 2016-02-16 | Disposition: A | Discharge status: Home / Self Care | Payer: Self-pay | Attending: Emergency Medicine | Admitting: Emergency Medicine

## 2016-02-16 ENCOUNTER — Encounter (HOSPITAL_BASED_OUTPATIENT_CLINIC_OR_DEPARTMENT_OTHER): Payer: Self-pay

## 2016-02-16 ENCOUNTER — Emergency Department (HOSPITAL_BASED_OUTPATIENT_CLINIC_OR_DEPARTMENT_OTHER): Payer: Self-pay

## 2016-02-16 DIAGNOSIS — M79661 Pain in right lower leg: Secondary | ICD-10-CM | POA: Insufficient documentation

## 2016-02-16 DIAGNOSIS — Z87891 Personal history of nicotine dependence: Secondary | ICD-10-CM | POA: Insufficient documentation

## 2016-02-16 DIAGNOSIS — R42 Dizziness and giddiness: Secondary | ICD-10-CM | POA: Insufficient documentation

## 2016-02-16 DIAGNOSIS — M549 Dorsalgia, unspecified: Secondary | ICD-10-CM | POA: Insufficient documentation

## 2016-02-16 DIAGNOSIS — M79604 Pain in right leg: Secondary | ICD-10-CM

## 2016-02-16 DIAGNOSIS — M79605 Pain in left leg: Secondary | ICD-10-CM

## 2016-02-16 DIAGNOSIS — M79662 Pain in left lower leg: Secondary | ICD-10-CM | POA: Insufficient documentation

## 2016-02-16 MED ORDER — NAPROXEN 500 MG PO TABS: 500.0000 mg | ORAL_TABLET | Freq: Two times a day (BID) | ORAL | 0 refills | Status: AC

## 2016-02-16 NOTE — ED Notes (Signed)
ED Provider at bedside. 

## 2016-02-16 NOTE — Discharge Instructions (Signed)
Please read and follow all provided instructions.  Your diagnoses today include:  1. Bilateral lower extremity pain     Tests performed today include:  Vital signs - see below for your results today  Urine test - no infection  Ultrasound - No blood clots in either leg  Medications prescribed:   Naproxen - anti-inflammatory pain medication  Do not exceed 500mg  naproxen every 12 hours, take with food  You have been prescribed an anti-inflammatory medication or NSAID. Take with food. Take smallest effective dose for the shortest duration needed for your pain. Stop taking if you experience stomach pain or vomiting.   Take any prescribed medications only as directed.  Home care instructions:   Follow any educational materials contained in this packet  Please rest, use ice or heat on your back for the next several days  Do not lift, push, pull anything more than 10 pounds for the next week  Follow-up instructions: Please follow-up with your primary care provider in the next 1 week for further evaluation of your symptoms.   Return instructions:  SEEK IMMEDIATE MEDICAL ATTENTION IF YOU HAVE:  New numbness, tingling, weakness, or problem with the use of your arms or legs  Severe back pain not relieved with medications  Loss control of your bowels or bladder  Increasing pain in any areas of the body (such as chest or abdominal pain)  Shortness of breath, dizziness, or fainting.   Worsening nausea (feeling sick to your stomach), vomiting, fever, or sweats  Any other emergent concerns regarding your health   Additional Information:  Your vital signs today were: BP 135/99 (BP Location: Left Arm)    Pulse 82    Temp 99.2 F (37.3 C) (Oral)    Resp 22    LMP 02/11/2016    SpO2 99%  If your blood pressure (BP) was elevated above 135/85 this visit, please have this repeated by your doctor within one month. --------------

## 2016-02-16 NOTE — ED Triage Notes (Signed)
C/o intermittent bilat leg pain x 1 week-denies injury-NAD-steady gait

## 2016-02-16 NOTE — ED Provider Notes (Signed)
[facility] Provider Note   CSN: [id number] Arrival date & time: 02/16/16  1525  By signing my name below, I, Alyssa GroveMartin Green, attest that this documentation has been prepared under the direction and in the presence of Renne CriglerJoshua Wadie Liew, PA-C. Electronically Signed: Alyssa GroveMartin Green, ED Scribe. 02/16/16. 4:34 PM.  History   Chief Complaint Chief Complaint  Patient presents with  . Leg Pain   The history is provided by the patient. No language interpreter was used.    HPI Comments: Karen Holmes is a 36 y.o. female who presents to the Emergency Department complaining of gradual onset, constant, unchanged, moderate bilateral calf pain for 1 week. Calf pain is worse on the right side and recently began radiating up her right leg. Left calf pain is intermittent. Calf pain is exacerbated with weight bearing. Pt has tried Aleve with temporary relief to pain. She reports associated lightheadedness and dizziness. She has hx of pinched nerves in her lower back.  Pt denies PMHx of anemia or any bleeding conditions. She denies PMHx of blood clots, PE/DVT. Pt denies recent long trips, hospitalizations, or surgeries. She denies use of estrogen and does not smoke. Pt takes supplements for help with inflammation, but does not take any regular medications. She is currently on her menstrual cycle. She denies nausea, vomiting, dark colored urine   Past Medical History:  Diagnosis Date  . GERD (gastroesophageal reflux disease)     There are no active problems to display for this patient.   Past Surgical History:  Procedure Laterality Date  . CESAREAN SECTION      OB History    No data available       Home Medications    Prior to Admission medications   Not on File    Family History No family history on file.  Social History Social History  Substance Use Topics  . Smoking status: Former Games developermoker  . Smokeless tobacco: Never Used  . Alcohol use No   Allergies   Latex   Review of  Systems Review of Systems  Constitutional: Negative for fever and unexpected weight change.  Gastrointestinal: Negative for constipation, nausea and vomiting.       Negative for fecal incontinence.   Genitourinary: Negative for dysuria, flank pain, hematuria, pelvic pain, vaginal bleeding and vaginal discharge.       No dark urine  Musculoskeletal: Positive for back pain and myalgias.  Neurological: Positive for dizziness and light-headedness. Negative for weakness and numbness.       Denies saddle paresthesias.   Physical Exam Updated Vital Signs BP 135/99 (BP Location: Left Arm)   Pulse 82   Temp 99.2 F (37.3 C) (Oral)   Resp 22   LMP 02/11/2016   SpO2 99%   Physical Exam  Constitutional: She appears well-developed and well-nourished.  HENT:  Head: Normocephalic and atraumatic.  Mouth/Throat: Oropharynx is clear and moist.  Eyes: Conjunctivae are normal. Right eye exhibits no discharge. Left eye exhibits no discharge.  No pale conjunctiva.  Neck: Normal range of motion. Neck supple.  Cardiovascular: Normal rate, regular rhythm and normal heart sounds.   Pulmonary/Chest: Effort normal and breath sounds normal.  Abdominal: Soft. There is no tenderness. There is no CVA tenderness.  Musculoskeletal: Normal range of motion.  Diffuse tenderness of the right calf without signs of bruising or infection.  Neurological: She is alert. She has normal strength and normal reflexes. No sensory deficit.  5/5 strength in entire lower extremities bilaterally. No sensation deficit.  Skin: Skin is warm and dry. No rash noted.  Psychiatric: She has a normal mood and affect.  Nursing note and vitals reviewed.  ED Treatments / Results  DIAGNOSTIC STUDIES: Oxygen Saturation is 99% on RA, normal by my interpretation.    COORDINATION OF CARE: 4:08 PM Discussed treatment plan with pt at bedside which includes US Venous bilateral and orthostatic vital signs and pt agreed to  plan.  Radiology US Venous Img Lower Bilateral  Result Date: 02/16/2016 CLINICAL DATA:  Bilateral calf tenderness for 1 week EXAM: BILATERAL LOWER EXTREMITY VENOUS DOPPLER ULTRASOUND TECHNIQUE: Gray-scale sonography with graded compression, as well as color Doppler and duplex ultrasound were performed to evaluate the lower extremity deep venous systems from the level of the common femoral vein and including the common femoral, femoral, profunda femoral, popliteal and calf veins including the posterior tibial, peroneal and gastrocnemius veins when visible. The superficial great saphenous vein was also interrogated. Spectral Doppler was utilized to evaluate flow at rest and with distal augmentation maneuvers in the common femoral, femoral and popliteal veins. COMPARISON:  None. FINDINGS: RIGHT LOWER EXTREMITY Common Femoral Vein: No evidence of thrombus. Normal compressibility, respiratory phasicity and response to augmentation. Saphenofemoral Junction: No evidence of thrombus. Normal compressibility and flow on color Doppler imaging. Profunda Femoral Vein: No evidence of thrombus. Normal compressibility and flow on color Doppler imaging. Femoral Vein: No evidence of thrombus. Normal compressibility, respiratory phasicity and response to augmentation. Popliteal Vein: No evidence of thrombus. Normal compressibility, respiratory phasicity and response to augmentation. Calf Veins: Not visible. Superficial Great Saphenous Vein: No evidence of thrombus. Normal compressibility and flow on color Doppler imaging. Venous Reflux:  None. Other Findings:  None. LEFT LOWER EXTREMITY Common Femoral Vein: No evidence of thrombus. Normal compressibility, respiratory phasicity and response to augmentation. Saphenofemoral Junction: No evidence of thrombus. Normal compressibility and flow on color Doppler imaging. Profunda Femoral Vein: No evidence of thrombus. Normal compressibility and flow on color Doppler imaging. Femoral Vein:  No evidence of thrombus. Normal compressibility, respiratory phasicity and response to augmentation. Popliteal Vein: No evidence of thrombus. Normal compressibility, respiratory phasicity and response to augmentation. Calf Veins: Not visible. Superficial Great Saphenous Vein: No evidence of thrombus. Normal compressibility and flow on color Doppler imaging. Venous Reflux:  None. Other Findings:  None. IMPRESSION: No evidence of deep venous thrombosis. Electronically Signed   By: Ellery Plunk M.D.   On: 02/16/2016 17:58    Procedures Procedures (including critical care time)  Medications Ordered in ED Medications - No data to display   Initial Impression / Assessment and Plan / ED Course  I have reviewed the triage vital signs and the nursing notes.  Pertinent labs & imaging results that were available during my care of the patient were reviewed by me and considered in my medical decision making (see chart for details).     Orthostatic VS for the past 24 hrs:  BP- Lying Pulse- Lying BP- Sitting Pulse- Sitting BP- Standing at 0 minutes Pulse- Standing at 0 minutes  02/16/16 1640 109/71 66 (!) 126/94 85 111/81 77    Pt updated on results. She is not significantly orthostatic. No red flag s/s of low back pain. Patient was counseled on back pain precautions and told to do activity as tolerated but do not lift, push, or pull heavy objects more than 10 pounds for the next week.  Patient counseled to use ice or heat on back for no longer than 15 minutes every hour.   Will  give course of NSAIDs. No apparent contraindications.   Patient urged to follow-up with PCP if pain does not improve with treatment and rest or if pain becomes recurrent. Urged to return with worsening severe pain, loss of bowel or bladder control, trouble walking.   The patient verbalizes understanding and agrees with the plan.   I personally performed the services described in this documentation, which was scribed in my  presence. The recorded information has been reviewed and is accurate.   Final Clinical Impressions(s) / ED Diagnoses   Final diagnoses:  Bilateral lower extremity pain   Patient with bilateral calf tenderness, right greater than left. No obvious etiology on exam. Good blood flow to both feet. Venous ultrasounds are negative for clot. Suspect musculoskeletal etiology. Will treat conservatively.  Patient's lightheadedness is vague and mild in nature. No full syncope. Orthostatics negative. Doubt anemia based on exam. No associated chest pain or shortness of breath. Do not feel additional workup is indicated at this time. Patient's main complaint is her lower extremity tenderness.  New Prescriptions New Prescriptions   NAPROXEN (NAPROSYN) 500 MG TABLET    Take 1 tablet (500 mg total) by mouth 2 (two) times daily.     Renne Crigler, PA-C 02/16/16 1853    Benjiman Core, MD 02/16/16 2329

## 2017-12-27 ENCOUNTER — Encounter (HOSPITAL_BASED_OUTPATIENT_CLINIC_OR_DEPARTMENT_OTHER): Payer: Self-pay

## 2017-12-27 ENCOUNTER — Emergency Department (HOSPITAL_BASED_OUTPATIENT_CLINIC_OR_DEPARTMENT_OTHER)
Admission: EM | Admit: 2017-12-27 | Discharge: 2017-12-27 | Disposition: A | Discharge status: Home / Self Care | Payer: Self-pay | Attending: Emergency Medicine | Admitting: Emergency Medicine

## 2017-12-27 ENCOUNTER — Other Ambulatory Visit: Payer: Self-pay

## 2017-12-27 DIAGNOSIS — G43019 Migraine without aura, intractable, without status migrainosus: Secondary | ICD-10-CM | POA: Insufficient documentation

## 2017-12-27 DIAGNOSIS — Z91040 Latex allergy status: Secondary | ICD-10-CM | POA: Insufficient documentation

## 2017-12-27 DIAGNOSIS — Z79899 Other long term (current) drug therapy: Secondary | ICD-10-CM | POA: Insufficient documentation

## 2017-12-27 DIAGNOSIS — Z87891 Personal history of nicotine dependence: Secondary | ICD-10-CM | POA: Insufficient documentation

## 2017-12-27 LAB — CBG MONITORING, ED: Glucose-Capillary: 95 mg/dL (ref 70–99)

## 2017-12-27 MED ORDER — DEXAMETHASONE SODIUM PHOSPHATE 10 MG/ML IJ SOLN
10.0000 mg | Freq: Once | INTRAMUSCULAR | Status: AC
  Administered 2017-12-27: 10 mg via INTRAVENOUS
  Filled 2017-12-27: qty 1

## 2017-12-27 MED ORDER — SODIUM CHLORIDE 0.9 % IV BOLUS
1000.0000 mL | Freq: Once | INTRAVENOUS | Status: AC
  Administered 2017-12-27: 1000 mL via INTRAVENOUS

## 2017-12-27 MED ORDER — KETOROLAC TROMETHAMINE 15 MG/ML IJ SOLN
30.0000 mg | Freq: Once | INTRAMUSCULAR | Status: AC
  Administered 2017-12-27: 30 mg via INTRAVENOUS
  Filled 2017-12-27: qty 2

## 2017-12-27 MED ORDER — METOCLOPRAMIDE HCL 5 MG/ML IJ SOLN
10.0000 mg | Freq: Once | INTRAMUSCULAR | Status: AC
  Administered 2017-12-27: 10 mg via INTRAVENOUS
  Filled 2017-12-27: qty 2

## 2017-12-27 MED ORDER — DIPHENHYDRAMINE HCL 50 MG/ML IJ SOLN
25.0000 mg | Freq: Once | INTRAMUSCULAR | Status: DC
  Filled 2017-12-27: qty 1

## 2017-12-27 NOTE — ED Triage Notes (Signed)
PT c/o HA, nausea, pt reports bilateral facial numbness, and lightheadedness. Symptoms intermittent since 12/7

## 2017-12-27 NOTE — ED Provider Notes (Signed)
[facility] EMERGENCY DEPARTMENT Provider Note   CSN: [id number] Arrival date & time: 12/27/17  1518     History   Chief Complaint Chief Complaint  Patient presents with  . Headache    HPI Karen Holmes is a 37 y.o. female presenting with migraine since Saturday. She woke up with migraine and had nausea and vomiting that has resolved. She reports intermittent numbness and tingling of her cheeks, lips, and tongue. She has been taking 800 mg ibuprofen without relief of headache. She reports she used to have migraines but hasn't had one in over 6 years. She was recently laid off work and is supposed to start a new job this week. She is also worried she has diabetes because when she eats something sugary her head seems to hurt more. Her grandmother had diabetes. No increased thirst, urination, or hunger.   HPI  Past Medical History:  Diagnosis Date  . GERD (gastroesophageal reflux disease)     There are no active problems to display for this patient.   Past Surgical History:  Procedure Laterality Date  . CESAREAN SECTION       OB History   No obstetric history on file.      Home Medications    Prior to Admission medications   Medication Sig Start Date End Date Taking? Authorizing Provider  naproxen (NAPROSYN) 500 MG tablet Take 1 tablet (500 mg total) by mouth 2 (two) times daily. 02/16/16   Renne Crigler, PA-C    Family History No family history on file.  Social History Social History   Tobacco Use  . Smoking status: Former Games developer  . Smokeless tobacco: Never Used  Substance Use Topics  . Alcohol use: No  . Drug use: No     Allergies   Latex   Review of Systems Review of Systems  Constitutional: Positive for activity change and appetite change. Negative for chills and fever.  HENT: Negative for congestion, facial swelling, sinus pressure, sinus pain and sore throat.   Eyes: Positive for photophobia. Negative for visual disturbance.    Respiratory: Negative for cough and shortness of breath.   Cardiovascular: Negative for chest pain and palpitations.  Gastrointestinal: Negative for abdominal pain, constipation, diarrhea, nausea and vomiting.  Genitourinary: Negative for decreased urine volume and dysuria.  Musculoskeletal: Negative for gait problem, myalgias and neck pain.  Skin: Negative for rash.  Neurological: Positive for light-headedness, numbness and headaches. Negative for dizziness, seizures, syncope, facial asymmetry and weakness.     Physical Exam Updated Vital Signs BP 127/71 (BP Location: Left Arm)   Pulse (!) 59   Temp 99 F (37.2 C) (Oral)   Resp 16   Ht 5\' 8"  (1.727 m)   Wt 122.5 kg   LMP 12/22/2017   SpO2 100%   BMI 41.05 kg/m   Physical Exam Vitals signs and nursing note reviewed.  HENT:     Head: Normocephalic and atraumatic.  Eyes:     Extraocular Movements: Extraocular movements intact.     Pupils: Pupils are equal, round, and reactive to light.  Neck:     Musculoskeletal: Normal range of motion and neck supple.  Cardiovascular:     Rate and Rhythm: Normal rate and regular rhythm.     Heart sounds: No murmur.  Pulmonary:     Effort: Pulmonary effort is normal.     Breath sounds: Normal breath sounds.  Abdominal:     General: There is no distension.     Palpations: Abdomen  is soft.     Tenderness: There is no abdominal tenderness.  Musculoskeletal: Normal range of motion.  Lymphadenopathy:     Cervical: No cervical adenopathy.  Skin:    General: Skin is warm and dry.  Neurological:     Mental Status: She is alert and oriented to person, place, and time.     Cranial Nerves: No cranial nerve deficit, dysarthria or facial asymmetry.      ED Treatments / Results  Labs (all labs ordered are listed, but only abnormal results are displayed) Labs Reviewed  PREGNANCY, URINE  CBG MONITORING, ED    EKG None  Radiology No results found.  Procedures Procedures (including  critical care time)  Medications Ordered in ED Medications  ketorolac (TORADOL) 15 MG/ML injection 30 mg (30 mg Intravenous Given 12/27/17 1806)  metoCLOPramide (REGLAN) injection 10 mg (10 mg Intravenous Given 12/27/17 1806)  dexamethasone (DECADRON) injection 10 mg (10 mg Intravenous Given 12/27/17 1856)  sodium chloride 0.9 % bolus 1,000 mL ( Intravenous Stopped 12/27/17 1940)     Initial Impression / Assessment and Plan / ED Course  I have reviewed the triage vital signs and the nursing notes.  Pertinent labs & imaging results that were available during my care of the patient were reviewed by me and considered in my medical decision making (see chart for details).  Clinical Course as of Dec 27 1941  Thu Dec 27, 2017  1726 BP: 132/83 [AR]    Clinical Course User Index [AR] Tillman SersRiccio, Siona Coulston C, DO    37 year old with intractable migraine. Neurologically in tact on exam. Will give migraine cocktail IV reglan, toradol, decadron and NS bolus. Will also check POC CBG, discussed this likely will not be diagnostic but patient concerned about high sugars.  Patient feels improved after migraine cocktail and fluids. CBG normal at 95. Patient is stable for discharge home. Reasons to return reviewed including neuro changes or worsening headache. Patient verbalized understanding and agreement with plan.   Final Clinical Impressions(s) / ED Diagnoses   Final diagnoses:  Intractable migraine without aura and without status migrainosus    ED Discharge Orders    None     Dolores PattyAngela Rifka Ramey, DO PGY-3, [facility] 12/27/2017 7:43 PM    Tillman Sersiccio, Brinson Tozzi C, DO 12/27/17 1943    Little, Ambrose Finlandachel Morgan, MD 12/29/17 1118

## 2017-12-27 NOTE — ED Notes (Signed)
EDP is made aware that patient drove herself here and I did not give the Benadryl.  EDP is okay with this and wrote  another order.

## 2017-12-27 NOTE — Discharge Instructions (Addendum)
°  Please take tylenol and/or ibuprofen as needed if headache returns.  Please return if you have any new or concerning symptoms.

## 2019-02-26 ENCOUNTER — Emergency Department (HOSPITAL_BASED_OUTPATIENT_CLINIC_OR_DEPARTMENT_OTHER): Payer: Medicaid Other

## 2019-02-26 ENCOUNTER — Emergency Department (HOSPITAL_BASED_OUTPATIENT_CLINIC_OR_DEPARTMENT_OTHER)
Admission: EM | Admit: 2019-02-26 | Discharge: 2019-02-26 | Disposition: A | Discharge status: Home / Self Care | Payer: Medicaid Other | Attending: Emergency Medicine | Admitting: Emergency Medicine

## 2019-02-26 ENCOUNTER — Encounter (HOSPITAL_BASED_OUTPATIENT_CLINIC_OR_DEPARTMENT_OTHER): Payer: Self-pay | Admitting: *Deleted

## 2019-02-26 ENCOUNTER — Other Ambulatory Visit: Payer: Self-pay

## 2019-02-26 DIAGNOSIS — M25511 Pain in right shoulder: Secondary | ICD-10-CM | POA: Diagnosis not present

## 2019-02-26 DIAGNOSIS — Z87891 Personal history of nicotine dependence: Secondary | ICD-10-CM | POA: Insufficient documentation

## 2019-02-26 DIAGNOSIS — M546 Pain in thoracic spine: Secondary | ICD-10-CM | POA: Insufficient documentation

## 2019-02-26 DIAGNOSIS — R0789 Other chest pain: Secondary | ICD-10-CM | POA: Diagnosis not present

## 2019-02-26 DIAGNOSIS — Z91040 Latex allergy status: Secondary | ICD-10-CM | POA: Insufficient documentation

## 2019-02-26 DIAGNOSIS — M25512 Pain in left shoulder: Secondary | ICD-10-CM | POA: Insufficient documentation

## 2019-02-26 DIAGNOSIS — Z880 Allergy status to penicillin: Secondary | ICD-10-CM | POA: Insufficient documentation

## 2019-02-26 NOTE — ED Provider Notes (Signed)
[facility] EMERGENCY DEPARTMENT Provider Note   CSN: [id number] Arrival date & time: 02/26/19  1448     History Chief Complaint  Patient presents with  . Back Pain  . shoulder pain    Karen Holmes is a 39 y.o. female w PMHx GERD, chronic low back pain, presenting to the ED with complaint of mid back pain that began 2 weeks ago. Pt states the pain radiates from the center of her back, across to both shoulders, with intermittent sharp pains. Pt is a CNA for work, and does frequent lifting, which makes sx worse. The back pain is not new. About 1 week ago, she began feeling a sensation of a "lump" under her left breast, with discomfort that seems to radiate from her back. She also feels a twitching sensation. She feels like the "lump/knot" is preventing her from taking a deep breath. Pain is worse with movement, laying down, and eating. No cardiac hx. Denies hx of HTN, HLD, DM, tobacco use, heart dz in 1st deg relative before age 73. She has treated sx with aleve and robaxin.  The history is provided by the patient.       Past Medical History:  Diagnosis Date  . GERD (gastroesophageal reflux disease)     There are no problems to display for this patient.   Past Surgical History:  Procedure Laterality Date  . CESAREAN SECTION       OB History    Gravida  2   Para  2   Term      Preterm      AB      Living        SAB      TAB      Ectopic      Multiple      Live Births              History reviewed. No pertinent family history.  Social History   Tobacco Use  . Smoking status: Former Games developer  . Smokeless tobacco: Never Used  . Tobacco comment: quit 8 years  Substance Use Topics  . Alcohol use: No  . Drug use: No    Home Medications Prior to Admission medications   Medication Sig Start Date End Date Taking? Authorizing Provider  naproxen (NAPROSYN) 500 MG tablet Take 1 tablet (500 mg total) by mouth 2 (two) times daily. 02/16/16    Renne Crigler, PA-C    Allergies    Penicillins and Latex  Review of Systems   Review of Systems  Musculoskeletal: Positive for back pain and myalgias.  All other systems reviewed and are negative.   Physical Exam Updated Vital Signs BP 132/87 (BP Location: Right Arm)   Pulse 67   Temp 98.2 F (36.8 C) (Oral)   Resp 18   Ht 5\' 8"  (1.727 m)   Wt 124.7 kg   LMP 02/25/2019   SpO2 100%   BMI 41.81 kg/m   Physical Exam Vitals and nursing note reviewed.  Constitutional:      General: She is not in acute distress.    Appearance: She is well-developed. She is obese. She is not ill-appearing.  HENT:     Head: Normocephalic and atraumatic.  Eyes:     Conjunctiva/sclera: Conjunctivae normal.  Cardiovascular:     Rate and Rhythm: Normal rate and regular rhythm.  Pulmonary:     Effort: Pulmonary effort is normal. No respiratory distress.     Breath sounds: Normal breath sounds.  Comments: ttp to left lower anterior chest wall below the breast. No skin changes, palpable masses, or deformity. Abdominal:     General: Bowel sounds are normal.     Palpations: Abdomen is soft.     Tenderness: There is no abdominal tenderness. There is no guarding or rebound.  Musculoskeletal:     Comments: TTP to thoracic back, generalized to midline and b/l paraspinal musculature, as well as b/l trapezius muscle groups. No skin changes or deformity. Nl ROM of b/l shoulders and neck.  Skin:    General: Skin is warm.  Neurological:     Mental Status: She is alert.  Psychiatric:        Behavior: Behavior normal.     ED Results / Procedures / Treatments   Labs (all labs ordered are listed, but only abnormal results are displayed) Labs Reviewed - No data to display  EKG EKG Interpretation  Date/Time:  Wednesday February 26 2019 09:35:19 EST Ventricular Rate:  71 PR Interval:    QRS Duration: 89 QT Interval:  377 QTC Calculation: 410 R Axis:   33 Text Interpretation: Sinus rhythm  Probable left atrial enlargement Low voltage, precordial leads no acute ST/T changes Confirmed by Sherwood Gambler [phone]) on 02/26/2019 10:20:53 AM   Radiology DG Chest 2 View  Result Date: 02/26/2019 CLINICAL DATA:  Back and chest wall pain. EXAM: CHEST - 2 VIEW COMPARISON:  Jun 04, 2014. FINDINGS: The heart size and mediastinal contours are within normal limits. Both lungs are clear. No pneumothorax or pleural effusion is noted. The visualized skeletal structures are unremarkable. IMPRESSION: No active cardiopulmonary disease. Electronically Signed   By: Marijo Conception M.D.   On: 02/26/2019 09:56    Procedures Procedures (including critical care time)  Medications Ordered in ED Medications - No data to display  ED Course  I have reviewed the triage vital signs and the nursing notes.  Pertinent labs & imaging results that were available during my care of the patient were reviewed by me and considered in my medical decision making (see chart for details).    MDM Rules/Calculators/A&P                      Pt presenting with chronic mid back pain and left sided chest wall pain. She is a CNA and does frequent lifting.  Symptoms are worse with movement and palpation.  Chest wall pain is easily reproducible with palpation and is worse with movement.  Very atypical for cardiac or pulmonary related pain.  Heart and lung sounds normal.  Vital signs normal.  Chest x-ray is negative, EKG is nonischemic.  Likely musculoskeletal in nature.  Patient discussed with Dr. Regenia Skeeter.  Will treat symptomatically and encourage PCP follow-up.  Patient is safe for discharge.  Discussed results, findings, treatment and follow up. Patient advised of return precautions. Patient verbalized understanding and agreed with plan.  Final Clinical Impression(s) / ED Diagnoses Final diagnoses:  Bilateral thoracic back pain, unspecified chronicity  Chest wall pain    Rx / DC Orders ED Discharge Orders    None        Kaion Tisdale, [place] N, PA-C 02/26/19 1129    Sherwood Gambler, MD 02/26/19 1434

## 2019-02-26 NOTE — Discharge Instructions (Addendum)
Continue treating your symptoms with over-the-counter medications, topical heat, muscle relaxers. Follow up with your primary care, or make an appointment with the sports medicine specialist. Return to the ER if you develop severely worsening chest pain, shortness of breath, or new concerning symptoms.

## 2019-02-26 NOTE — ED Notes (Signed)
ED Provider at bedside. 

## 2019-02-26 NOTE — ED Triage Notes (Signed)
Mid back pain radiating to left and right sides started 2 weeks ago.  She felt like there is a lump on her left ribcage that causes her to have breathing difficulty.  Stated that took Robaxin for 2 weeks with no relief.

## 2019-04-08 ENCOUNTER — Emergency Department (HOSPITAL_BASED_OUTPATIENT_CLINIC_OR_DEPARTMENT_OTHER)
Admission: EM | Admit: 2019-04-08 | Discharge: 2019-04-08 | Disposition: A | Discharge status: Home / Self Care | Payer: Medicaid Other | Attending: Emergency Medicine | Admitting: Emergency Medicine

## 2019-04-08 ENCOUNTER — Encounter (HOSPITAL_BASED_OUTPATIENT_CLINIC_OR_DEPARTMENT_OTHER): Payer: Self-pay

## 2019-04-08 ENCOUNTER — Other Ambulatory Visit: Payer: Self-pay

## 2019-04-08 DIAGNOSIS — R197 Diarrhea, unspecified: Secondary | ICD-10-CM | POA: Diagnosis present

## 2019-04-08 HISTORY — DX: Dorsalgia, unspecified: M54.9

## 2019-04-08 LAB — COMPREHENSIVE METABOLIC PANEL
ALT: 17 U/L (ref 0–44)
AST: 20 U/L (ref 15–41)
Albumin: 3.9 g/dL (ref 3.5–5.0)
Alkaline Phosphatase: 67 U/L (ref 38–126)
Anion gap: 9 (ref 5–15)
BUN: 9 mg/dL (ref 6–20)
CO2: 24 mmol/L (ref 22–32)
Calcium: 9.2 mg/dL (ref 8.9–10.3)
Chloride: 104 mmol/L (ref 98–111)
Creatinine, Ser: 0.88 mg/dL (ref 0.44–1.00)
GFR calc Af Amer: >60 mL/min (ref >60)
GFR calc non Af Amer: >60 mL/min (ref >60)
Glucose, Bld: 125 mg/dL — ABNORMAL HIGH (ref 70–99)
Potassium: 3.3 mmol/L — ABNORMAL LOW (ref 3.5–5.1)
Sodium: 137 mmol/L (ref 135–145)
Total Bilirubin: 0.7 mg/dL (ref 0.3–1.2)
Total Protein: 7.6 g/dL (ref 6.5–8.1)

## 2019-04-08 LAB — C DIFFICILE QUICK SCREEN W PCR REFLEX
C Diff antigen: NEGATIVE
C Diff interpretation: NOT DETECTED
C Diff toxin: NEGATIVE

## 2019-04-08 LAB — CBC
HCT: 41.4 % (ref 36.0–46.0)
Hemoglobin: 14 g/dL (ref 12.0–15.0)
MCH: 28.1 pg (ref 26.0–34.0)
MCHC: 33.8 g/dL (ref 30.0–36.0)
MCV: 83.1 fL (ref 80.0–100.0)
Platelets: 339 10*3/uL (ref 150–400)
RBC: 4.98 MIL/uL (ref 3.87–5.11)
RDW: 15.3 % (ref 11.5–15.5)
WBC: 6.7 10*3/uL (ref 4.0–10.5)
nRBC: 0 % (ref 0.0–0.2)

## 2019-04-08 LAB — LIPASE, BLOOD: Lipase: 19 U/L (ref 11–51)

## 2019-04-08 MED ORDER — ACETAMINOPHEN 500 MG PO TABS
1000.0000 mg | ORAL_TABLET | Freq: Once | ORAL | Status: AC
  Administered 2019-04-08: 1000 mg via ORAL
  Filled 2019-04-08: qty 2

## 2019-04-08 MED ORDER — POTASSIUM CHLORIDE CRYS ER 20 MEQ PO TBCR
40.0000 meq | EXTENDED_RELEASE_TABLET | Freq: Once | ORAL | Status: AC
  Administered 2019-04-08: 40 meq via ORAL
  Filled 2019-04-08: qty 2

## 2019-04-08 MED ORDER — DICYCLOMINE HCL 20 MG PO TABS: 20.0000 mg | ORAL_TABLET | Freq: Two times a day (BID) | ORAL | 0 refills | Status: AC

## 2019-04-08 MED ORDER — SODIUM CHLORIDE 0.9 % IV BOLUS
1000.0000 mL | Freq: Once | INTRAVENOUS | Status: AC
  Administered 2019-04-08: 1000 mL via INTRAVENOUS

## 2019-04-08 MED ORDER — PROCHLORPERAZINE EDISYLATE 10 MG/2ML IJ SOLN
10.0000 mg | Freq: Once | INTRAMUSCULAR | Status: AC
  Administered 2019-04-08: 10 mg via INTRAVENOUS
  Filled 2019-04-08: qty 2

## 2019-04-08 MED FILL — DICYCLOMINE 20 MG TABLET: 20 | 10 days supply | Qty: 20 | Fill #0

## 2019-04-08 NOTE — ED Notes (Signed)
Pt unable to provide urine at this time

## 2019-04-08 NOTE — ED Provider Notes (Signed)
[facility] Emergency Department Provider Note MRN:  [id number]  Arrival date & time: 04/08/19     Chief Complaint   Diarrhea   History of Present Illness   Karen Holmes is a 39 y.o. year-old female with a history of GERD presenting to the ED with chief complaint of diarrhea.  4 days of illness.  Watery diarrhea, every 30 minutes at home.  Also endorsing subjective fever, muscle aches, migratory abdominal discomfort.  Denies chest pain or shortness of breath, no cough, mild dull frontal headache.  Symptoms constant, no exacerbating or alleviating factors.  Finished a course of antibiotics 1 week ago.  No blood in the stool.  No vomiting.   Review of Systems  A complete 10 system review of systems was obtained and all systems are negative except as noted in the HPI and PMH.   Patient's Health History    Past Medical History:  Diagnosis Date  . Back pain   . GERD (gastroesophageal reflux disease)     Past Surgical History:  Procedure Laterality Date  . CESAREAN SECTION      No family history on file.  Social History   Socioeconomic History  . Marital status: Single    Spouse name: Not on file  . Number of children: Not on file  . Years of education: Not on file  . Highest education level: Not on file  Occupational History  . Not on file  Tobacco Use  . Smoking status: Former Research scientist (life sciences)  . Smokeless tobacco: Never Used  . Tobacco comment: quit 8 years  Substance and Sexual Activity  . Alcohol use: No  . Drug use: No  . Sexual activity: Not Currently    Birth control/protection: None  Other Topics Concern  . Not on file  Social History Narrative  . Not on file   Social Determinants of Health   Financial Resource Strain:   . Difficulty of Paying Living Expenses:   Food Insecurity:   . Worried About Charity fundraiser in the Last Year:   . Arboriculturist in the Last Year:   Transportation Needs:   . Film/video editor  (Medical):   Marland Kitchen Lack of Transportation (Non-Medical):   Physical Activity:   . Days of Exercise per Week:   . Minutes of Exercise per Session:   Stress:   . Feeling of Stress :   Social Connections:   . Frequency of Communication with Friends and Family:   . Frequency of Social Gatherings with Friends and Family:   . Attends Religious Services:   . Active Member of Clubs or Organizations:   . Attends Archivist Meetings:   Marland Kitchen Marital Status:   Intimate Partner Violence:   . Fear of Current or Ex-Partner:   . Emotionally Abused:   Marland Kitchen Physically Abused:   . Sexually Abused:      Physical Exam   Vitals:   04/08/19 0907 04/08/19 1130  BP: (!) 143/80 135/85  Pulse: 81 76  Resp: 18 18  SpO2: 98% 100%    CONSTITUTIONAL: Well-appearing, NAD NEURO:  Alert and oriented x 3, no focal deficits EYES:  eyes equal and reactive ENT/NECK:  no LAD, no JVD CARDIO: Regular rate, well-perfused, normal S1 and S2 PULM:  CTAB no wheezing or rhonchi GI/GU:  normal bowel sounds, non-distended, non-tender MSK/SPINE:  No gross deformities, no edema SKIN:  no rash, atraumatic PSYCH:  Appropriate speech and behavior  *Additional and/or pertinent findings  included in MDM below  Diagnostic and Interventional Summary    EKG Interpretation  Date/Time:    Ventricular Rate:    PR Interval:    QRS Duration:   QT Interval:    QTC Calculation:   R Axis:     Text Interpretation:        Labs Reviewed  COMPREHENSIVE METABOLIC PANEL - Abnormal; Notable for the following components:      Result Value   Potassium 3.3 (*)    Glucose, Bld 125 (*)    All other components within normal limits  C DIFFICILE QUICK SCREEN W PCR REFLEX  GI PATHOGEN PANEL BY PCR, STOOL  CBC  LIPASE, BLOOD  PREGNANCY, URINE  URINALYSIS, ROUTINE W REFLEX MICROSCOPIC    No orders to display    Medications  sodium chloride 0.9 % bolus 1,000 mL ( Intravenous Stopped 04/08/19 0929)  prochlorperazine (COMPAZINE)  injection 10 mg (10 mg Intravenous Given 04/08/19 0841)  acetaminophen (TYLENOL) tablet 1,000 mg (1,000 mg Oral Given 04/08/19 0840)  potassium chloride SA (KLOR-CON) CR tablet 40 mEq (40 mEq Oral Given 04/08/19 0936)     Procedures  /  Critical Care Procedures  ED Course and Medical Decision Making  I have reviewed the triage vital signs, the nursing notes, and pertinent available records from the EMR.  Pertinent labs & imaging results that were available during my care of the patient were reviewed by me and considered in my medical decision making (see below for details).     Suspect viral process, also considering C. difficile given the recent prescription for combination medication Pylera, which includes bismuth, metronidazole, tetracycline.  Patient was treated for H. pylori infection, finished this course of antibiotics 1 week ago.  Will obtain stool sample if she is able to provide, will hydrate, check basic labs.  Abdomen is soft and nontender, little to no concern for acute surgical process, no indication for imaging at this time.  Patient continues to look and feel well with normal vital signs, work-up is unrevealing, patient will return home to quarantine and follow-up stool testing results as well as coronavirus results.  Elmer Sow. Pilar Plate, MD [facility] Emergency [facility] [email or web address]  Final Clinical Impressions(s) / ED Diagnoses     ICD-10-CM   1. Diarrhea of presumed infectious origin  R19.7     ED Discharge Orders         Ordered    dicyclomine (BENTYL) 20 MG tablet  2 times daily     04/08/19 1148           Discharge Instructions Discussed with and Provided to Patient:     Discharge Instructions     You were evaluated in the Emergency Department and after careful evaluation, we did not find any emergent condition requiring admission or further testing in the hospital.  Your exam/testing today is overall reassuring.  We  recommend continued home quarantine until you receive your coronavirus testing results.  Your symptoms could be all explained by COVID-19.  We have also tested you for C. difficile, which is a bacteria that causes a specific type of diarrhea.  You should receive these results within the next few hours.  If your C. difficile test is positive, you will need to follow-up closely with your primary care doctor and be prescribed antibiotics.  We recommend increased fluids at home, Tylenol or Motrin as needed for discomfort.  You can also use the Bentyl pain medication provided for abdominal discomfort.  Please return to the Emergency Department if you experience any worsening of your condition.  We encourage you to follow up with a primary care provider.  Thank you for allowing [place] to be a part of your care.       Sabas Sous, MD 04/08/19 1150

## 2019-04-08 NOTE — Discharge Instructions (Addendum)
You were evaluated in the Emergency Department and after careful evaluation, we did not find any emergent condition requiring admission or further testing in the hospital.  Your exam/testing today is overall reassuring.  We recommend continued home quarantine until you receive your coronavirus testing results.  Your symptoms could be all explained by COVID-19.  We have also tested you for C. difficile, which is a bacteria that causes a specific type of diarrhea.  You should receive these results within the next few hours.  If your C. difficile test is positive, you will need to follow-up closely with your primary care doctor and be prescribed antibiotics.  We recommend increased fluids at home, Tylenol or Motrin as needed for discomfort.  You can also use the Bentyl pain medication provided for abdominal discomfort.  Please return to the Emergency Department if you experience any worsening of your condition.  We encourage you to follow up with a primary care provider.  Thank you for allowing [place] to be a part of your care.

## 2019-04-08 NOTE — ED Notes (Signed)
ED Provider at bedside. 

## 2019-04-08 NOTE — ED Triage Notes (Signed)
Pt arrive to ED with reports of diarrhea since Saturday states that she is also having bad stomach pain and headaches. ND, no vomiting. Pt also having fever and muscle aches since Saturday.

## 2019-04-10 LAB — GI PATHOGEN PANEL BY PCR, STOOL

## 2021-02-16 ENCOUNTER — Other Ambulatory Visit (HOSPITAL_BASED_OUTPATIENT_CLINIC_OR_DEPARTMENT_OTHER): Payer: Self-pay

## 2021-02-16 ENCOUNTER — Other Ambulatory Visit: Payer: Self-pay

## 2021-02-16 ENCOUNTER — Encounter (HOSPITAL_BASED_OUTPATIENT_CLINIC_OR_DEPARTMENT_OTHER): Payer: Self-pay

## 2021-02-16 ENCOUNTER — Emergency Department (HOSPITAL_BASED_OUTPATIENT_CLINIC_OR_DEPARTMENT_OTHER)
Admission: EM | Admit: 2021-02-16 | Discharge: 2021-02-16 | Disposition: A | Discharge status: Home / Self Care | Payer: Medicaid Other | Attending: Emergency Medicine | Admitting: Emergency Medicine

## 2021-02-16 DIAGNOSIS — Z91040 Latex allergy status: Secondary | ICD-10-CM | POA: Insufficient documentation

## 2021-02-16 DIAGNOSIS — Z79899 Other long term (current) drug therapy: Secondary | ICD-10-CM | POA: Diagnosis not present

## 2021-02-16 DIAGNOSIS — N898 Other specified noninflammatory disorders of vagina: Secondary | ICD-10-CM | POA: Insufficient documentation

## 2021-02-16 DIAGNOSIS — R350 Frequency of micturition: Secondary | ICD-10-CM | POA: Diagnosis not present

## 2021-02-16 DIAGNOSIS — R102 Pelvic and perineal pain: Secondary | ICD-10-CM | POA: Diagnosis not present

## 2021-02-16 LAB — URINALYSIS, ROUTINE W REFLEX MICROSCOPIC
Bilirubin Urine: NEGATIVE
Glucose, UA: NEGATIVE mg/dL
Hgb urine dipstick: NEGATIVE
Ketones, ur: 15 mg/dL — AB
Leukocytes,Ua: NEGATIVE
Nitrite: NEGATIVE
Protein, ur: NEGATIVE mg/dL
Specific Gravity, Urine: 1.02 (ref 1.005–1.030)
pH: 7 (ref 5.0–8.0)

## 2021-02-16 LAB — PREGNANCY, URINE: Preg Test, Ur: NEGATIVE

## 2021-02-16 LAB — WET PREP, GENITAL
Clue Cells Wet Prep HPF POC: NONE SEEN
Sperm: NONE SEEN
Trich, Wet Prep: NONE SEEN
WBC, Wet Prep HPF POC: <10 (ref <10)
Yeast Wet Prep HPF POC: NONE SEEN

## 2021-02-16 MED ORDER — PHENAZOPYRIDINE HCL 200 MG PO TABS
200.0000 mg | ORAL_TABLET | Freq: Three times a day (TID) | ORAL | 0 refills | Status: AC
  Filled 2021-02-16: qty 6, 2d supply, fill #0

## 2021-02-16 NOTE — ED Provider Notes (Signed)
[facility] EMERGENCY DEPARTMENT Provider Note   CSN: [id number] Arrival date & time: 02/16/21  1005     History  Chief Complaint  Patient presents with   Pelvic Pain    Karen Holmes is a 41 y.o. female.   Pelvic Pain   Patient presents with pelvic pressure, urinary frequency times few days.  She states she started feeling unwell about 2 weeks ago when she had gastroenteritis with vomiting and diarrhea, started having increased urinary frequency a few days ago, denies any fever.  Had an axillary temp of T-max 99.9.  History of trichomoniasis in August, repeat labs negative and has not any sexual activity since then.  Denies any vaginal discharge, no dyspareunia.    Patient states she has a history of IBS but denies any history of colonoscopy.  She is seen by [facility], stool at that time was notable for H. pylori but I do not see any documentation of endoscopy or colonoscopy performed at that time.  Home Medications Prior to Admission medications   Medication Sig Start Date End Date Taking? Authorizing Provider  phenazopyridine (PYRIDIUM) 200 MG tablet Take 1 tablet (200 mg total) by mouth 3 (three) times daily. 02/16/21  Yes Theron Arista, PA-C  dicyclomine (BENTYL) 20 MG tablet Take 1 tablet (20 mg total) by mouth 2 (two) times daily. 04/08/19   Sabas Sous, MD  gabapentin (NEURONTIN) 300 MG capsule Take 300 mg by mouth 3 (three) times daily. 03/11/19   [provider]  methocarbamol (ROBAXIN) 750 MG tablet Take 750 mg by mouth 3 (three) times daily as needed. 03/29/19   [provider]  naproxen (NAPROSYN) 500 MG tablet Take 1 tablet (500 mg total) by mouth 2 (two) times daily. 02/16/16   Renne Crigler, PA-C  omeprazole (PRILOSEC) 20 MG capsule Take 20 mg by mouth daily. 03/11/19   [provider]  Claudine Mouton [phone] MG capsule SMARTSIG:3 Capsule(s) By Mouth 5 Times Daily 03/18/19   [provider]      Allergies    Penicillins  and Latex    Review of Systems   Review of Systems  Genitourinary:  Positive for pelvic pain.   Physical Exam Updated Vital Signs BP (!) 148/72 (BP Location: Right Arm)    Pulse 88    Temp 98.6 F (37 C) (Oral)    Resp 18    Ht 5\' 8"  (1.727 m)    Wt 136.1 kg    LMP 02/01/2021 (Approximate)    SpO2 100%    BMI 45.61 kg/m  Physical Exam Vitals and nursing note reviewed. Exam conducted with a chaperone present.  Constitutional:      Appearance: Normal appearance. She is obese.  HENT:     Head: Normocephalic and atraumatic.  Eyes:     General: No scleral icterus.       Right eye: No discharge.        Left eye: No discharge.     Extraocular Movements: Extraocular movements intact.     Pupils: Pupils are equal, round, and reactive to light.  Cardiovascular:     Rate and Rhythm: Normal rate and regular rhythm.     Pulses: Normal pulses.     Heart sounds: Normal heart sounds. No murmur heard.   No friction rub. No gallop.  Pulmonary:     Effort: Pulmonary effort is normal. No respiratory distress.     Breath sounds: Normal breath sounds.  Abdominal:     General: Abdomen is  flat. Bowel sounds are normal. There is no distension.     Palpations: Abdomen is soft.  Genitourinary:    Comments: Opaque vaginal discharge throughout the vaginal canal.  No cervical wall motion tenderness, some discomfort with pressure of the right adnexa but no guarding or rigidity. Skin:    General: Skin is warm and dry.     Coloration: Skin is not jaundiced.  Neurological:     Mental Status: She is alert. Mental status is at baseline.     Coordination: Coordination normal.    ED Results / Procedures / Treatments   Labs (all labs ordered are listed, but only abnormal results are displayed) Labs Reviewed  URINALYSIS, ROUTINE W REFLEX MICROSCOPIC - Abnormal; Notable for the following components:      Result Value   Ketones, ur 15 (*)    All other components within normal limits  WET PREP, GENITAL   PREGNANCY, URINE  GC/CHLAMYDIA PROBE AMP ([place]) NOT AT [facility]    EKG None  Radiology No results found.  Procedures Procedures    Medications Ordered in ED Medications - No data to display  ED Course/ Medical Decision Making/ A&P                           Medical Decision Making Amount and/or Complexity of Data Reviewed Labs: ordered.  Risk Prescription drug management.   This patient presents to the ED for concern of pelvic pain and urinary frequency, this involves an extensive number of treatment options, and is a complaint that carries with it a high risk of complications and morbidity.  The differential diagnosis includes pelvic pain   Co morbidities that complicate the patient evaluation: body habitus    Additional history obtained: -External records from outside source obtained and reviewed including: Chart review including previous notes, labs, imaging, consultation notes   Lab Tests: -I ordered, reviewed, and interpreted labs.  The pertinent results include:  UA without any evidence of UTI, not pregnant.  Negative for trichomoniasis, bacterial vaginosis.   Medicines ordered and prescription drug management: -I ordered medication including pyridium  for dysuria    -Reevaluation of the patient after these medicines showed that the patient improved -I have reviewed the patients home medicines and have made adjustments as needed   ED Course: Vitals are stable, nontoxic-appearing.  Doubt PID, symptoms or not intermittent or consistent with an ovarian torsion.  Considered abscess but given not febrile not tachycardic I doubt that.  Also not having any septic symptoms.  Suspect muscle strain given worse with ambulation, no focal tenderness concerning for an acute abdomen additionally no peritoneal signs.  Do not feel she needs additional work-up at this time.  Will provide information for GI and gynecologic follow-up.  Return precaution discussed, discharged in  stable condition.   Reevaluation: After the interventions noted above, I reevaluated the patient and found that they have :improved   Dispostion: D/C          Final Clinical Impression(s) / ED Diagnoses Final diagnoses:  Pelvic pain in female    Rx / DC Orders ED Discharge Orders          Ordered    phenazopyridine (PYRIDIUM) 200 MG tablet  3 times daily        02/16/21 1219              Theron Arista, PA-C 02/16/21 1603    Pollyann Savoy, MD 02/17/21 [phone]

## 2021-02-16 NOTE — ED Triage Notes (Signed)
C/o pelvic pressure/pain, urinary frequency, back pain

## 2021-02-16 NOTE — Discharge Instructions (Addendum)
Please check MyChart for the result of GC and chlamydia.  There is no evidence of trichomoniasis or bacterial vaginosis.  Additionally your urine does not show any signs of UTI.  I do not see a source of the pain during the pelvic exam.  Please take Pyridium for the next few days, sometimes this can help with the urinary frequency.  Continue to follow-up with your primary as needed.  Have also given you information for a gynecologist group as well as a gastroenterology group.  Please set up appointments for follow-up.

## 2021-02-16 NOTE — ED Notes (Signed)
Unable to provide urine specimen at this time.  

## 2021-02-17 LAB — GC/CHLAMYDIA PROBE AMP (~~LOC~~) NOT AT ARMC
Chlamydia: NEGATIVE
Comment: NEGATIVE
Comment: NORMAL
Neisseria Gonorrhea: NEGATIVE

## 2021-03-29 ENCOUNTER — Other Ambulatory Visit: Payer: Self-pay

## 2021-03-29 ENCOUNTER — Emergency Department (HOSPITAL_BASED_OUTPATIENT_CLINIC_OR_DEPARTMENT_OTHER)
Admission: EM | Admit: 2021-03-29 | Discharge: 2021-03-29 | Disposition: A | Discharge status: Home / Self Care | Payer: Medicaid Other | Attending: Emergency Medicine | Admitting: Emergency Medicine

## 2021-03-29 ENCOUNTER — Encounter (HOSPITAL_BASED_OUTPATIENT_CLINIC_OR_DEPARTMENT_OTHER): Payer: Self-pay | Admitting: Urology

## 2021-03-29 ENCOUNTER — Emergency Department (HOSPITAL_BASED_OUTPATIENT_CLINIC_OR_DEPARTMENT_OTHER): Payer: Medicaid Other

## 2021-03-29 DIAGNOSIS — Z91040 Latex allergy status: Secondary | ICD-10-CM | POA: Insufficient documentation

## 2021-03-29 DIAGNOSIS — G4486 Cervicogenic headache: Secondary | ICD-10-CM | POA: Insufficient documentation

## 2021-03-29 DIAGNOSIS — R519 Headache, unspecified: Secondary | ICD-10-CM | POA: Diagnosis present

## 2021-03-29 NOTE — ED Triage Notes (Signed)
Pt states pin and needles feeling in back of head since Friday  ?"Feels like rubber band squeezing"  ?States right sided face pain as well  ? ?H/o migraines ?

## 2021-03-29 NOTE — ED Notes (Signed)
Patient transported to CT 

## 2021-03-29 NOTE — ED Notes (Signed)
ED Provider at bedside to discuss test results, pt left without awaiting paperwork for discharge, refused VS prior to departure. ?

## 2021-03-29 NOTE — Discharge Instructions (Addendum)
Recommend following up back with your primary care doctor.  We do not have MRI available here unfortunately. ? ? ?

## 2021-03-29 NOTE — ED Provider Notes (Signed)
?[facility] ?Provider Note ? ? ?CSN: [id number] ?Arrival date & time: 03/29/21  1236 ? ?  ? ?History ? ?Chief Complaint  ?Patient presents with  ? Headache  ? ? ?Karen Holmes is a 41 y.o. female. ? ?Patient states that since Friday she has been having shooting pain upper part of the right neck goes up into the back of the head.  Occasionally it occurs on the left side.  No arm symptoms.  No pain with movement of the neck.  Has a history of migraines but this is not like her migraines normally they are frontal in nature and have some visual changes or photophobia changes.  No rash either.  Which is reassuring at this point in time since things started on Friday I think if this was zoster it would be broken out by now but is also does involve the left side as well so that would rule that out.  Patient states that it is kind of like pins and needle feeling.  Again no upper extremity symptoms at all.  And no lower extremity symptoms.  No incontinence. ? ?Patient states that she is followed by [facility].  States that sometime prior to December she had MRI of the neck done.  They were not able to find that in our system.  Patient states not able to take pain medicine she does not do well on that.  And also that she has a history of significant reflux disease and prednisone does not work very well for her either.  Patient has a longstanding history of lower back problems.  Denies any significant lower extremity symptoms or any incontinence.  Chart review does show that in 2012 she did have MRI studies of her thoracic and lumbar back.  Also had MRI of the brain without any acute findings then.  Chart review shows that she was seen or either is received a referral to neurology at [facility] on March 2.  That says lumbar radiculopathy.  But the note is not complete so I cannot read that.  I do not see any evidence of any recent MRIs. ? ?Patient's past medical history is  significant for esophageal reflux disease and back pain.  No listed surgeries on her back just cesarean section. ? ? ? ? ?  ? ?Home Medications ?Prior to Admission medications   ?Medication Sig Start Date End Date Taking? Authorizing Provider  ?dicyclomine (BENTYL) 20 MG tablet Take 1 tablet (20 mg total) by mouth 2 (two) times daily. 04/08/19   Maudie Flakes, MD  ?gabapentin (NEURONTIN) 300 MG capsule Take 300 mg by mouth 3 (three) times daily. 03/11/19   [provider]  ?methocarbamol (ROBAXIN) 750 MG tablet Take 750 mg by mouth 3 (three) times daily as needed. 03/29/19   [provider]  ?naproxen (NAPROSYN) 500 MG tablet Take 1 tablet (500 mg total) by mouth 2 (two) times daily. 02/16/16   Carlisle Cater, PA-C  ?omeprazole (PRILOSEC) 20 MG capsule Take 20 mg by mouth daily. 03/11/19   [provider]  ?phenazopyridine (PYRIDIUM) 200 MG tablet Take 1 tablet (200 mg total) by mouth 3 (three) times daily. 02/16/21   Sherrill Raring, PA-C  ?Charlyn Minerva [id number] MG capsule SMARTSIG:3 Capsule(s) By Mouth 5 Times Daily 03/18/19   [provider]  ?   ? ?Allergies    ?Penicillins and Latex   ? ?Review of Systems   ?Review of Systems  ?Constitutional:  Negative for chills  and fever.  ?HENT:  Negative for ear pain and sore throat.   ?Eyes:  Negative for photophobia, pain and visual disturbance.  ?Respiratory:  Negative for cough and shortness of breath.   ?Cardiovascular:  Negative for chest pain and palpitations.  ?Gastrointestinal:  Negative for abdominal pain and vomiting.  ?Genitourinary:  Negative for dysuria and hematuria.  ?Musculoskeletal:  Negative for arthralgias and back pain.  ?Skin:  Negative for color change and rash.  ?Neurological:  Positive for headaches. Negative for seizures, syncope, speech difficulty, weakness and numbness.  ?All other systems reviewed and are negative. ? ?Physical Exam ?Updated Vital Signs ?BP (!) 154/93 (BP Location: Right Arm)   Pulse 70   Temp 99 ?F (37.2  ?C) (Oral)   Resp 18   Ht 1.727 m (5\' 8" )   Wt (!) 138.3 kg   LMP 03/03/2021 (Approximate)   SpO2 100%   BMI 46.38 kg/m?  ?Physical Exam ?Vitals and nursing note reviewed.  ?Constitutional:   ?   General: She is not in acute distress. ?   Appearance: Normal appearance. She is well-developed.  ?HENT:  ?   Head: Normocephalic and atraumatic.  ?Eyes:  ?   Extraocular Movements: Extraocular movements intact.  ?   Conjunctiva/sclera: Conjunctivae normal.  ?   Pupils: Pupils are equal, round, and reactive to light.  ?Cardiovascular:  ?   Rate and Rhythm: Normal rate and regular rhythm.  ?   Heart sounds: No murmur heard. ?Pulmonary:  ?   Effort: Pulmonary effort is normal. No respiratory distress.  ?   Breath sounds: Normal breath sounds.  ?Abdominal:  ?   Palpations: Abdomen is soft.  ?   Tenderness: There is no abdominal tenderness.  ?Musculoskeletal:     ?   General: No swelling.  ?   Cervical back: Normal range of motion and neck supple. No rigidity or tenderness.  ?Lymphadenopathy:  ?   Cervical: No cervical adenopathy.  ?Skin: ?   General: Skin is warm and dry.  ?   Capillary Refill: Capillary refill takes less than 2 seconds.  ?   Findings: No rash.  ?Neurological:  ?   General: No focal deficit present.  ?   Mental Status: She is alert and oriented to person, place, and time.  ?   Cranial Nerves: No cranial nerve deficit.  ?   Sensory: No sensory deficit.  ?   Motor: No weakness.  ?Psychiatric:     ?   Mood and Affect: Mood normal.  ? ? ?ED Results / Procedures / Treatments   ?Labs ?(all labs ordered are listed, but only abnormal results are displayed) ?Labs Reviewed - No data to display ? ?EKG ?None ? ?Radiology ?CT Head Wo Contrast ? ?Result Date: 03/29/2021 ?CLINICAL DATA:  Chronic headache and neck pain. No trauma. EXAM: CT HEAD WITHOUT CONTRAST CT CERVICAL SPINE WITHOUT CONTRAST TECHNIQUE: Multidetector CT imaging of the head and cervical spine was performed following the standard protocol without  intravenous contrast. Multiplanar CT image reconstructions of the cervical spine were also generated. RADIATION DOSE REDUCTION: This exam was performed according to the departmental dose-optimization program which includes automated exposure control, adjustment of the mA and/or kV according to patient size and/or use of iterative reconstruction technique. COMPARISON:  Head CT 04/23/2008 FINDINGS: CT HEAD FINDINGS Brain: The ventricles are normal in size and configuration. No extra-axial fluid collections are identified. The gray-white differentiation is maintained. No CT findings for acute hemispheric infarction or intracranial hemorrhage. No mass  lesions. The brainstem and cerebellum are normal. Vascular: No hyperdense vessels or obvious aneurysm. Skull: No acute skull fracture. No bone lesion. Sinuses/Orbits: The paranasal sinuses and mastoid air cells are clear. The globes are intact. Other: No scalp lesions, laceration or hematoma. CT CERVICAL SPINE FINDINGS Alignment: Normal Skull base and vertebrae: No acute skull fracture. Age advanced degenerative changes with large bridging marginal osteophyte anteriorly. Soft tissues and spinal canal: No prevertebral fluid or swelling. No visible canal hematoma. Disc levels: No large disc protrusions, significant spinal or foraminal stenosis. Upper chest: The lung apices are grossly clear. Other: No neck mass, adenopathy or hematoma. IMPRESSION: 1. No acute intracranial findings or skull fracture. 2. 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 Electronically Signed   By: Marijo Sanes M.D.   On: 03/29/2021 16:27  ? ?CT Cervical Spine Wo Contrast ? ?Result Date: 03/29/2021 ?CLINICAL DATA:  Chronic headache and neck pain. No trauma. EXAM: CT HEAD WITHOUT CONTRAST CT CERVICAL SPINE WITHOUT CONTRAST TECHNIQUE: Multidetector CT imaging of the head and cervical spine was performed following the standard protocol  without intravenous contrast. Multiplanar CT image reconstructions of the cervical spine were also generated. RADIATION DOSE REDUCTION: This exam was performed according to the departmental dose-optimization program wh

## 2023-08-27 IMAGING — CT CT HEAD W/O CM
3 of 4 series · 14 of 47 positions shown, 16 images · non-contrast
Comparison: Head CT 04/23/2008

CLINICAL DATA: Chronic headache and neck pain. No trauma.



[Series 3: head 2.0 h70h · axial · 0.51mm/px · z∈[+186,+330]mm · 8 of 90 slices shown, 10 images]
[im 9/90  brain]
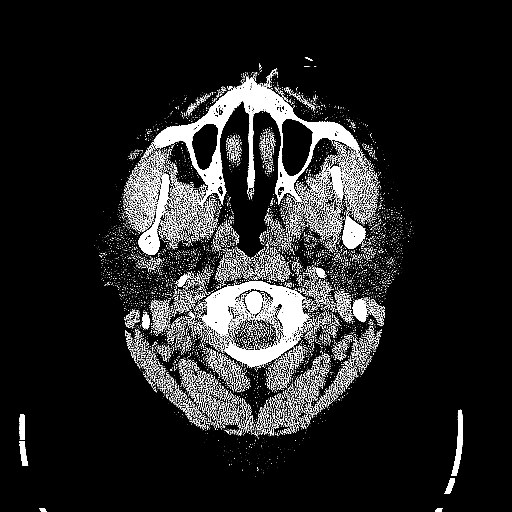
[im 9/90  bone]
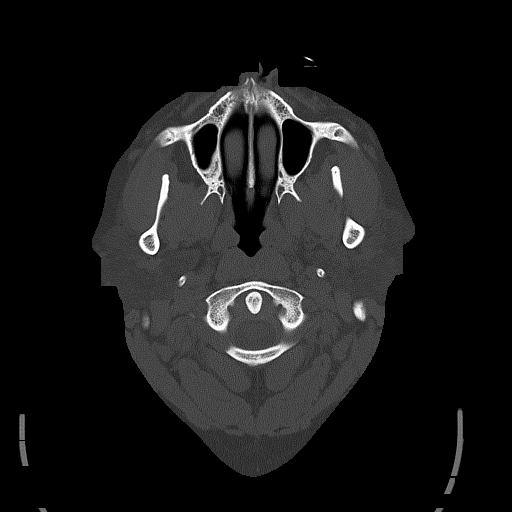
[im 18/90  brain]
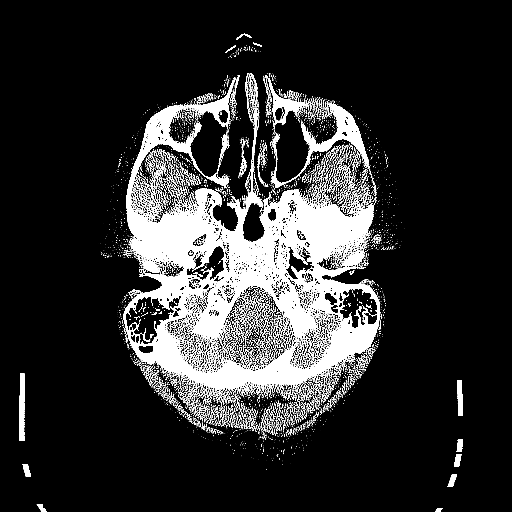
[im 27/90  brain]
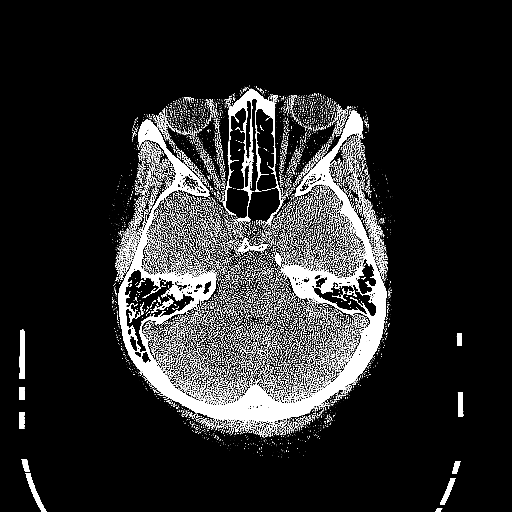
[im 41/90  brain]
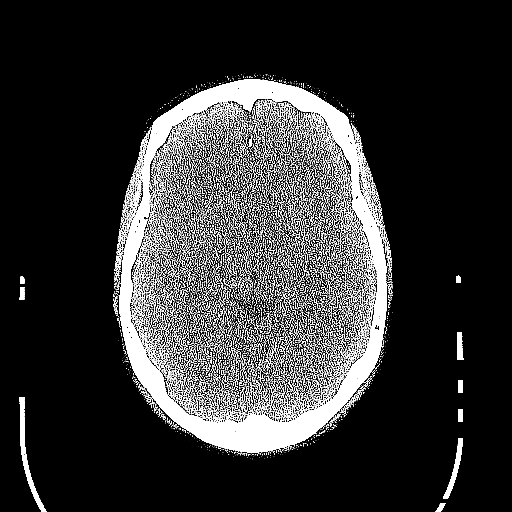
[im 49/90  brain]
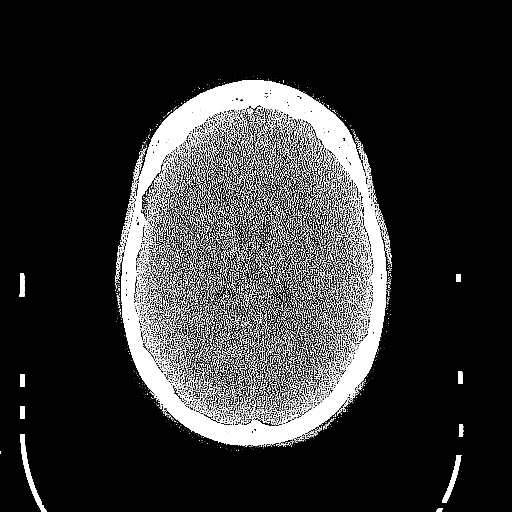
[im 49/90  bone]
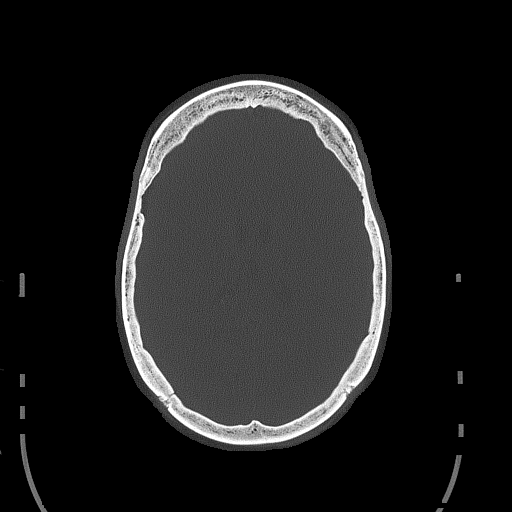
[im 63/90  brain]
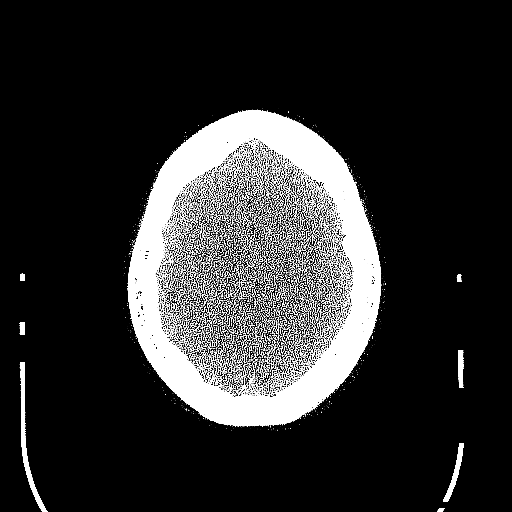
[im 72/90  brain]
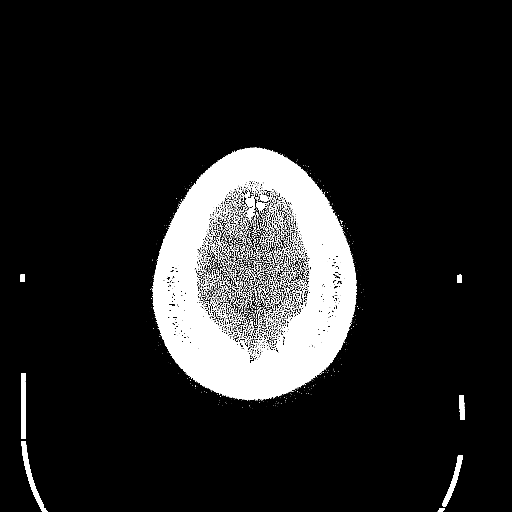
[im 81/90  brain]
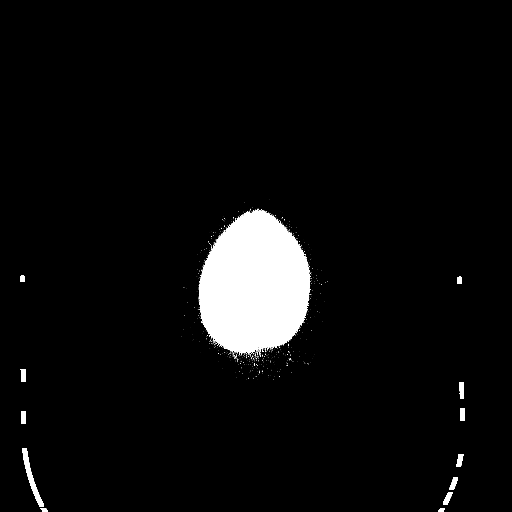

[Series 4: head 3.0 mpr cor · coronal · 0.35mm/px · 3 of 74 slices shown]
[im 25/74  brain]
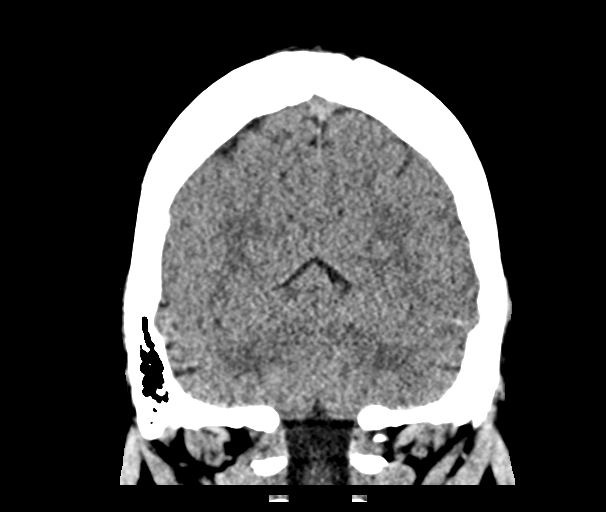
[im 33/74  brain]
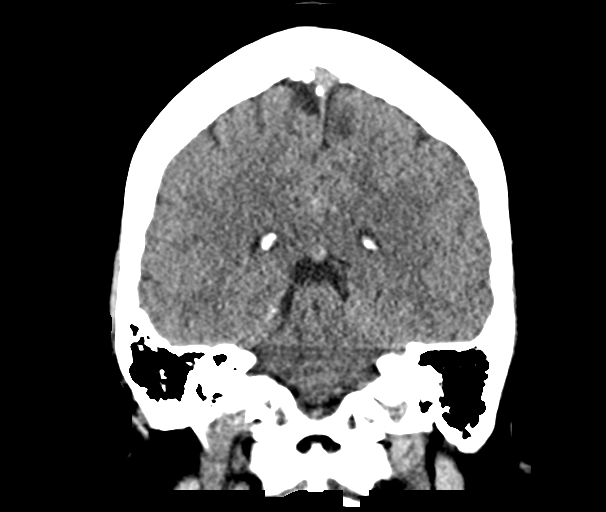
[im 41/74  brain]
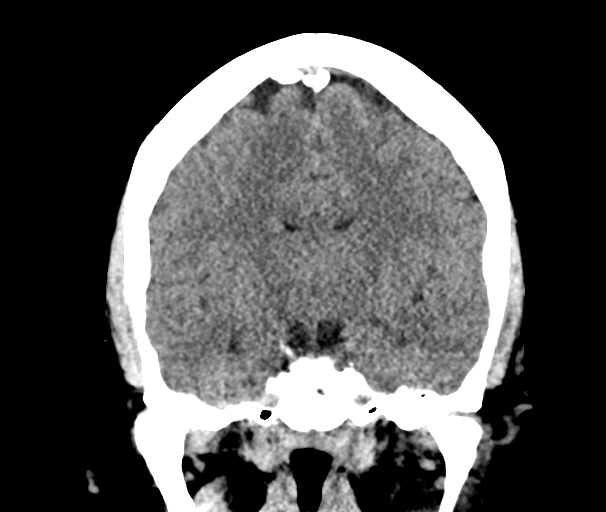

[Series 5: head 3.0 mpr sag · sagittal · 0.35mm/px · 3 of 67 slices shown]
[im 23/67  brain]
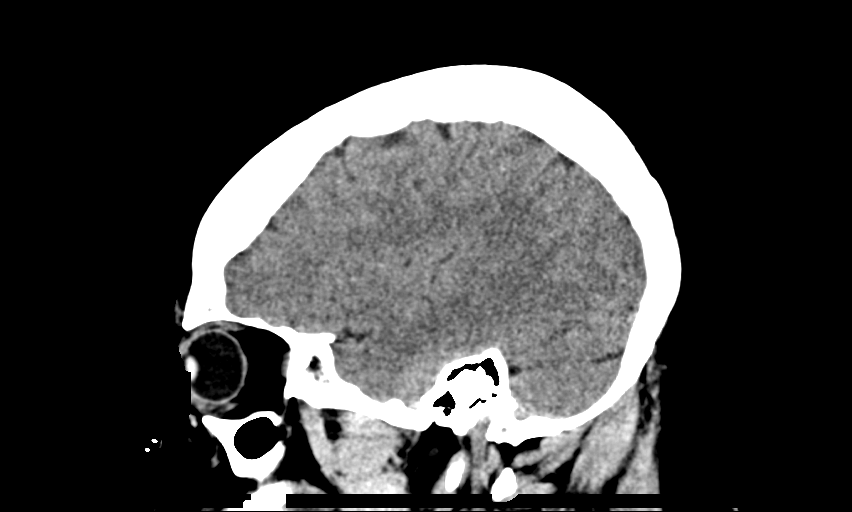
[im 34/67  brain]
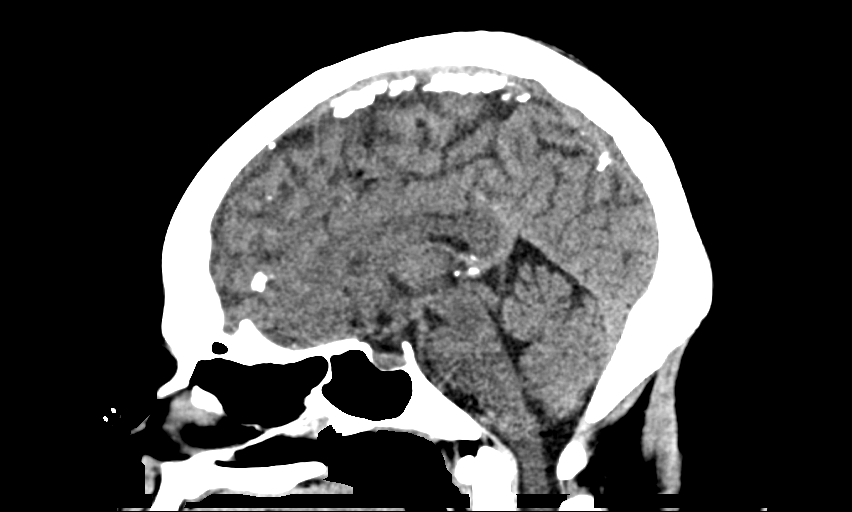
[im 45/67  brain]
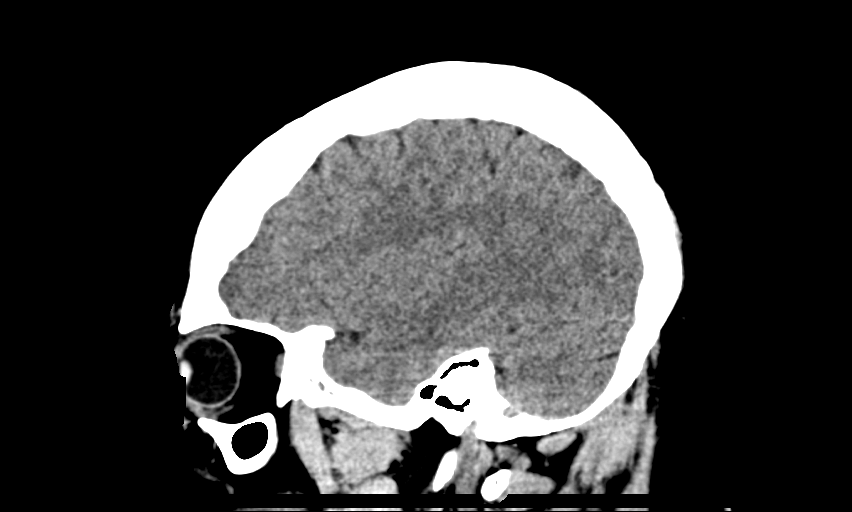

[14 of 47 positions shown; findings below may reference images not displayed]

FINDINGS: CT HEAD FINDINGS

Brain: The ventricles are normal in size and configuration. No
extra-axial fluid collections are identified. The gray-white
differentiation is maintained. No CT findings for acute hemispheric
infarction or intracranial hemorrhage. No mass lesions. The
brainstem and cerebellum are normal.

Vascular: No hyperdense vessels or obvious aneurysm.

Skull: No acute skull fracture. No bone lesion.

Sinuses/Orbits: The paranasal sinuses and mastoid air cells are
clear. The globes are intact.

Other: No scalp lesions, laceration or hematoma.

CT CERVICAL SPINE FINDINGS

Alignment: Normal

Skull base and vertebrae: No acute skull fracture. Age advanced
degenerative changes with large bridging marginal osteophyte
anteriorly.

Soft tissues and spinal canal: No prevertebral fluid or swelling. No
visible canal hematoma.

Disc levels: No large disc protrusions, significant spinal or
foraminal stenosis.

Upper chest: The lung apices are grossly clear.

Other: No neck mass, adenopathy or hematoma.
IMPRESSION: 1. No acute intracranial findings or skull fracture.
2. Age advanced degenerative changes in the cervical spine but no
acute cervical spine fracture or subluxation.

## 2023-08-27 IMAGING — CT CT CERVICAL SPINE W/O CM
4 of 8 series · 14 of 33 positions shown, 15 images · non-contrast
Comparison: Head CT 04/23/2008

CLINICAL DATA: Chronic headache and neck pain. No trauma.



[Series 4: c_spine 2.0 i30s 3 · axial · 0.37mm/px · z∈[+94,+200]mm · 4 of 89 slices shown, 5 images]
[im 18/89  soft-tissue]
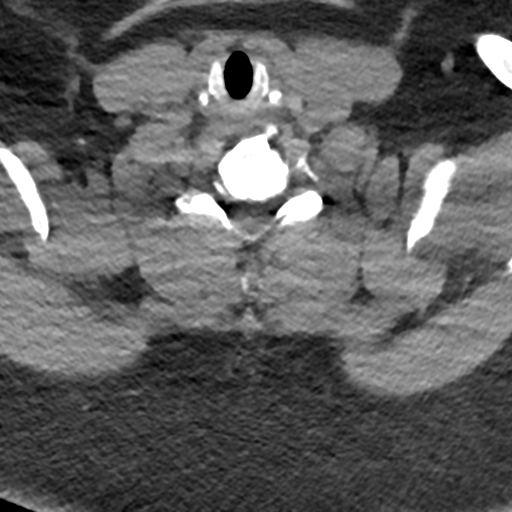
[im 18/89  bone]
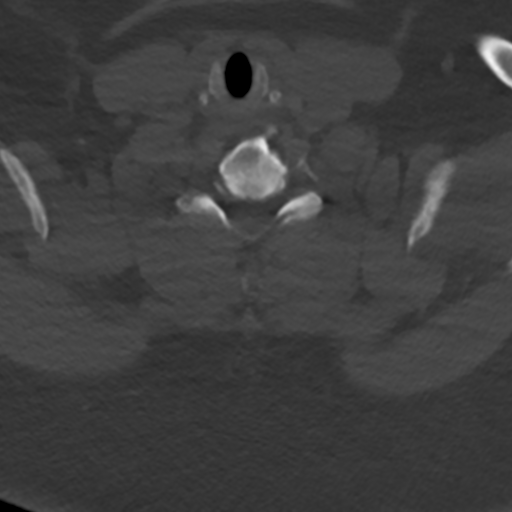
[im 36/89  bone]
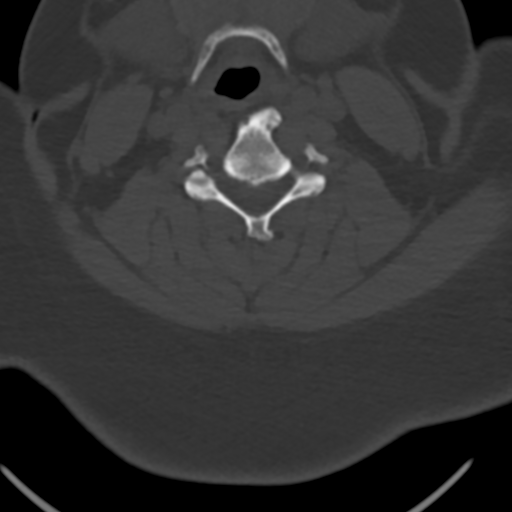
[im 53/89  bone]
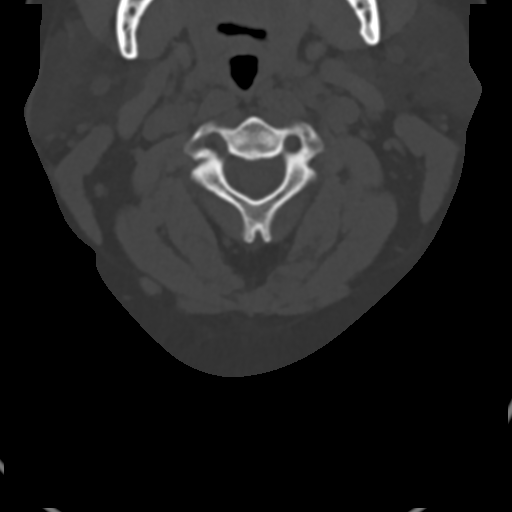
[im 71/89  bone]
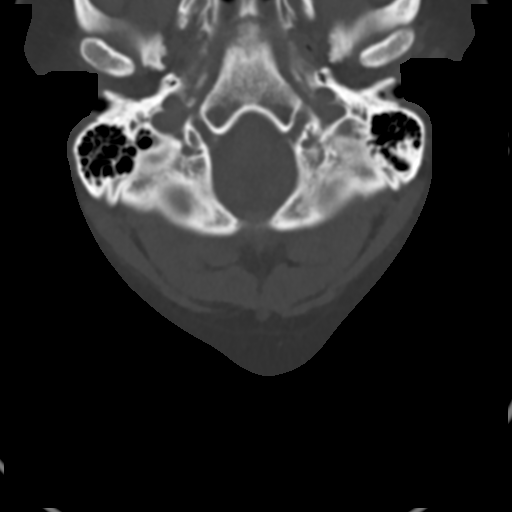

[Series 8: coronals · coronal · 0.26mm/px · 1 of 61 slices shown]
[im 31/61  bone]
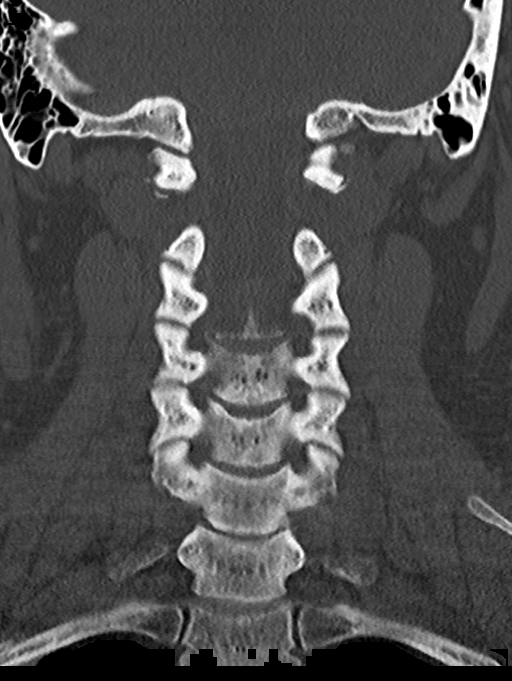

[Series 9: sagittals · sagittal · 0.26mm/px · 5 of 61 slices shown]
[im 11/61  bone]
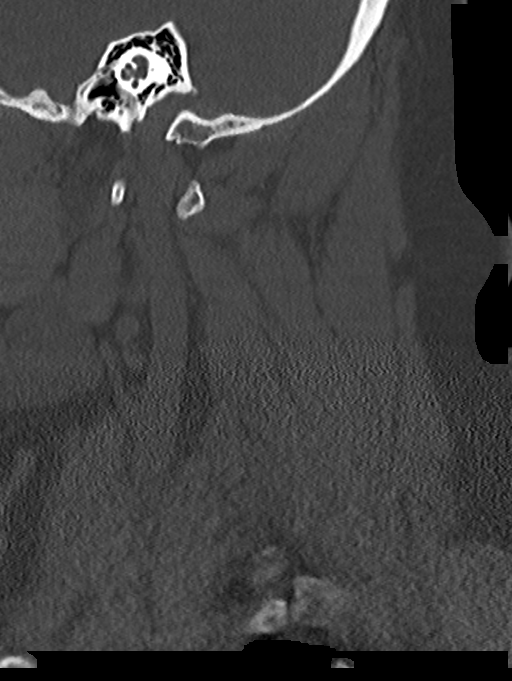
[im 21/61  bone]
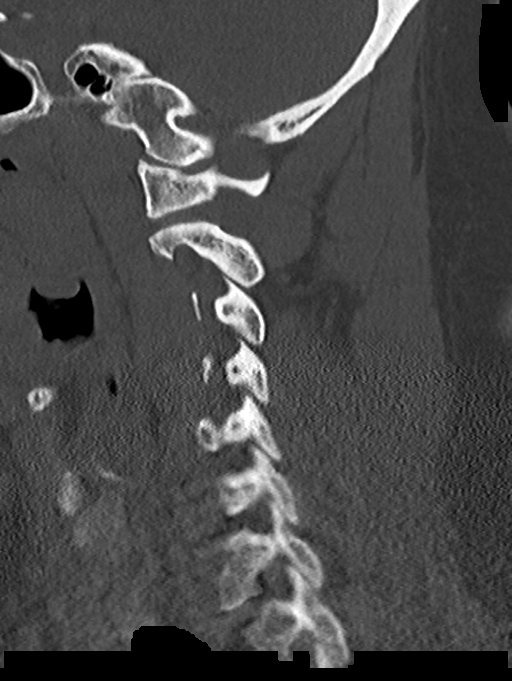
[im 31/61  bone]
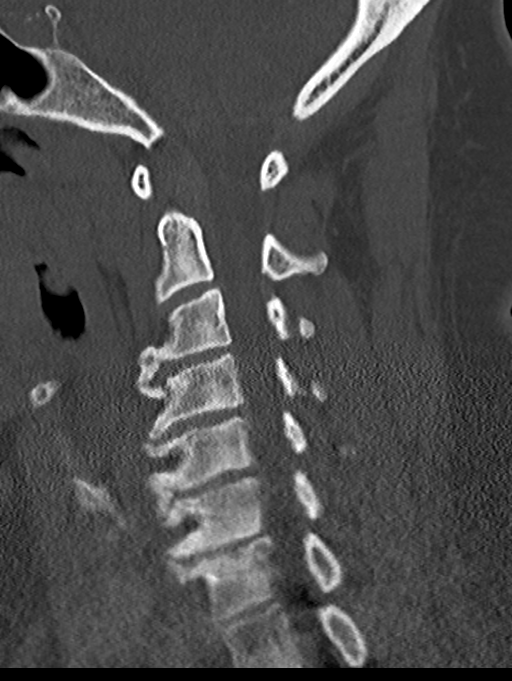
[im 41/61  bone]
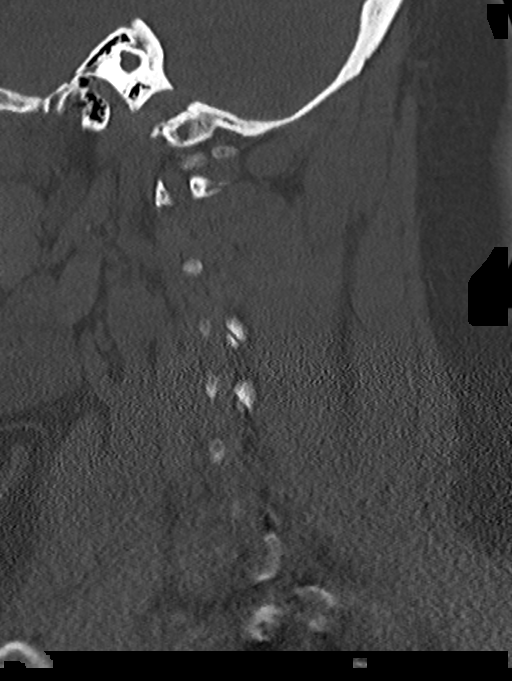
[im 51/61  bone]
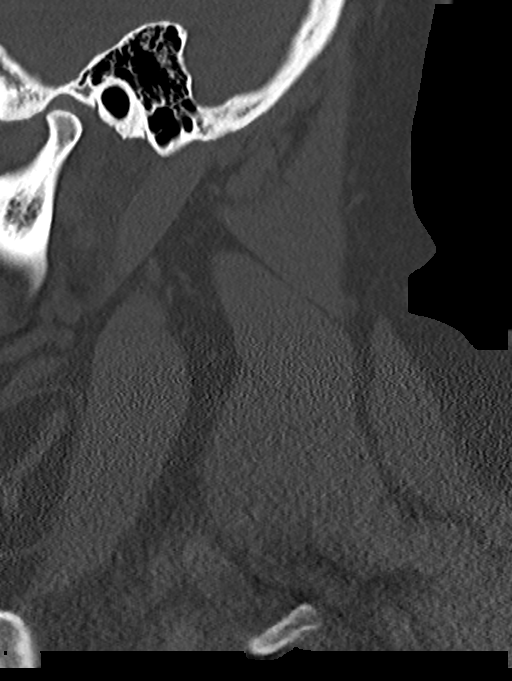

[Series 10: orthogonals · axial · 0.23mm/px · z∈[+83,+190]mm · 4 of 90 slices shown]
[im 18/90  bone]
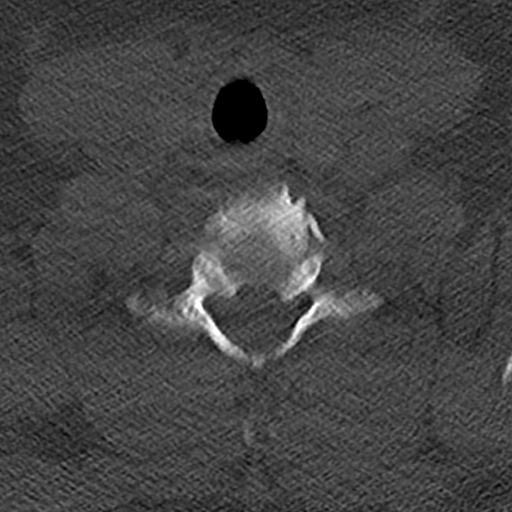
[im 36/90  bone]
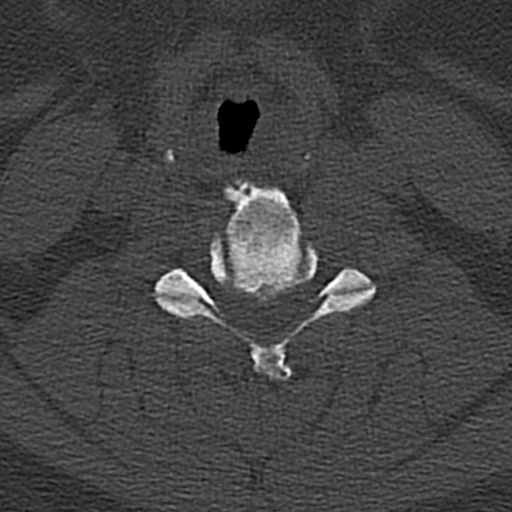
[im 54/90  bone]
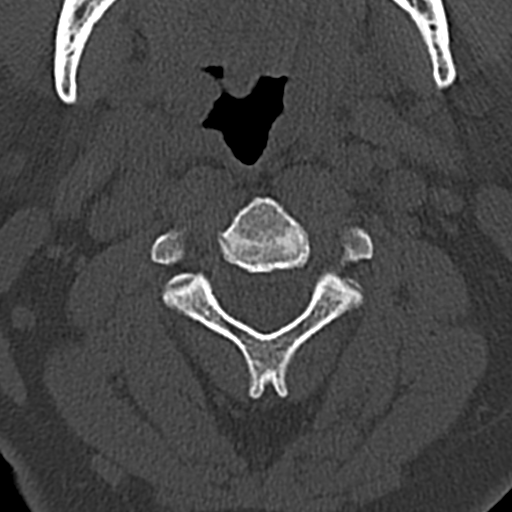
[im 72/90  bone]
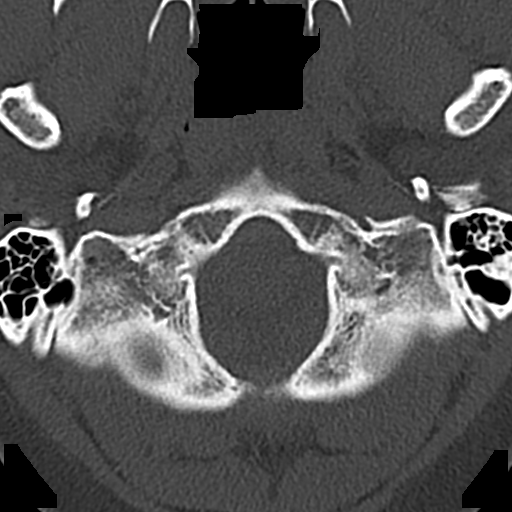

[14 of 33 positions shown; findings below may reference images not displayed]

FINDINGS: CT HEAD FINDINGS

Brain: The ventricles are normal in size and configuration. No
extra-axial fluid collections are identified. The gray-white
differentiation is maintained. No CT findings for acute hemispheric
infarction or intracranial hemorrhage. No mass lesions. The
brainstem and cerebellum are normal.

Vascular: No hyperdense vessels or obvious aneurysm.

Skull: No acute skull fracture. No bone lesion.

Sinuses/Orbits: The paranasal sinuses and mastoid air cells are
clear. The globes are intact.

Other: No scalp lesions, laceration or hematoma.

CT CERVICAL SPINE FINDINGS

Alignment: Normal

Skull base and vertebrae: No acute skull fracture. Age advanced
degenerative changes with large bridging marginal osteophyte
anteriorly.

Soft tissues and spinal canal: No prevertebral fluid or swelling. No
visible canal hematoma.

Disc levels: No large disc protrusions, significant spinal or
foraminal stenosis.

Upper chest: The lung apices are grossly clear.

Other: No neck mass, adenopathy or hematoma.
IMPRESSION: 1. No acute intracranial findings or skull fracture.
2. Age advanced degenerative changes in the cervical spine but no
acute cervical spine fracture or subluxation.
